# Patient Record
Sex: Male | Born: 1970
Health system: Southern US, Community
[De-identification: ages and names within clinical notes are randomized; demographics above are authoritative.]

## PROBLEM LIST (undated history)

## (undated) DIAGNOSIS — E079 Disorder of thyroid, unspecified: Secondary | ICD-10-CM

## (undated) HISTORY — PX: MYRINGOTOMY WITH TUBE PLACEMENT: SHX5663

## (undated) HISTORY — DX: Disorder of thyroid, unspecified: E07.9

## (undated) HISTORY — PX: APPENDECTOMY: SHX54

## (undated) HISTORY — PX: CHOLECYSTECTOMY: SHX55

---
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
## 2004-07-08 ENCOUNTER — Ambulatory Visit: Payer: Self-pay | Admitting: Otolaryngology

## 2005-07-16 ENCOUNTER — Ambulatory Visit: Payer: Self-pay | Admitting: Otolaryngology

## 2007-08-31 ENCOUNTER — Inpatient Hospital Stay: Payer: Self-pay | Admitting: Internal Medicine

## 2007-08-31 ENCOUNTER — Other Ambulatory Visit: Payer: Self-pay

## 2007-09-02 ENCOUNTER — Other Ambulatory Visit: Payer: Self-pay

## 2009-01-11 ENCOUNTER — Ambulatory Visit: Payer: Self-pay | Admitting: Otolaryngology

## 2009-08-11 LAB — HM COLONOSCOPY

## 2011-08-14 DIAGNOSIS — J301 Allergic rhinitis due to pollen: Secondary | ICD-10-CM | POA: Diagnosis not present

## 2011-08-21 DIAGNOSIS — J301 Allergic rhinitis due to pollen: Secondary | ICD-10-CM | POA: Diagnosis not present

## 2011-09-04 DIAGNOSIS — J301 Allergic rhinitis due to pollen: Secondary | ICD-10-CM | POA: Diagnosis not present

## 2011-09-10 DIAGNOSIS — L738 Other specified follicular disorders: Secondary | ICD-10-CM | POA: Diagnosis not present

## 2011-09-11 DIAGNOSIS — J301 Allergic rhinitis due to pollen: Secondary | ICD-10-CM | POA: Diagnosis not present

## 2011-09-17 DIAGNOSIS — J301 Allergic rhinitis due to pollen: Secondary | ICD-10-CM | POA: Diagnosis not present

## 2011-09-23 DIAGNOSIS — J301 Allergic rhinitis due to pollen: Secondary | ICD-10-CM | POA: Diagnosis not present

## 2011-09-24 DIAGNOSIS — J301 Allergic rhinitis due to pollen: Secondary | ICD-10-CM | POA: Diagnosis not present

## 2011-10-01 DIAGNOSIS — J301 Allergic rhinitis due to pollen: Secondary | ICD-10-CM | POA: Diagnosis not present

## 2011-10-08 DIAGNOSIS — J301 Allergic rhinitis due to pollen: Secondary | ICD-10-CM | POA: Diagnosis not present

## 2011-10-14 DIAGNOSIS — Q760 Spina bifida occulta: Secondary | ICD-10-CM | POA: Insufficient documentation

## 2011-10-14 DIAGNOSIS — Z8774 Personal history of (corrected) congenital malformations of heart and circulatory system: Secondary | ICD-10-CM | POA: Insufficient documentation

## 2011-10-14 DIAGNOSIS — Q909 Down syndrome, unspecified: Secondary | ICD-10-CM | POA: Insufficient documentation

## 2011-10-14 DIAGNOSIS — Q238 Other congenital malformations of aortic and mitral valves: Secondary | ICD-10-CM | POA: Insufficient documentation

## 2011-10-15 DIAGNOSIS — J301 Allergic rhinitis due to pollen: Secondary | ICD-10-CM | POA: Diagnosis not present

## 2011-10-22 DIAGNOSIS — J301 Allergic rhinitis due to pollen: Secondary | ICD-10-CM | POA: Diagnosis not present

## 2011-10-29 DIAGNOSIS — J301 Allergic rhinitis due to pollen: Secondary | ICD-10-CM | POA: Diagnosis not present

## 2011-11-06 DIAGNOSIS — J301 Allergic rhinitis due to pollen: Secondary | ICD-10-CM | POA: Diagnosis not present

## 2011-11-12 DIAGNOSIS — J301 Allergic rhinitis due to pollen: Secondary | ICD-10-CM | POA: Diagnosis not present

## 2011-11-26 DIAGNOSIS — J301 Allergic rhinitis due to pollen: Secondary | ICD-10-CM | POA: Diagnosis not present

## 2011-12-03 DIAGNOSIS — J301 Allergic rhinitis due to pollen: Secondary | ICD-10-CM | POA: Diagnosis not present

## 2011-12-05 DIAGNOSIS — J301 Allergic rhinitis due to pollen: Secondary | ICD-10-CM | POA: Diagnosis not present

## 2011-12-10 DIAGNOSIS — J301 Allergic rhinitis due to pollen: Secondary | ICD-10-CM | POA: Diagnosis not present

## 2011-12-17 DIAGNOSIS — J301 Allergic rhinitis due to pollen: Secondary | ICD-10-CM | POA: Diagnosis not present

## 2011-12-31 DIAGNOSIS — J301 Allergic rhinitis due to pollen: Secondary | ICD-10-CM | POA: Diagnosis not present

## 2012-01-21 DIAGNOSIS — J301 Allergic rhinitis due to pollen: Secondary | ICD-10-CM | POA: Diagnosis not present

## 2012-02-04 DIAGNOSIS — J301 Allergic rhinitis due to pollen: Secondary | ICD-10-CM | POA: Diagnosis not present

## 2012-02-05 DIAGNOSIS — M7520 Bicipital tendinitis, unspecified shoulder: Secondary | ICD-10-CM | POA: Diagnosis not present

## 2012-02-05 DIAGNOSIS — Z23 Encounter for immunization: Secondary | ICD-10-CM | POA: Diagnosis not present

## 2012-02-13 DIAGNOSIS — Z23 Encounter for immunization: Secondary | ICD-10-CM | POA: Diagnosis not present

## 2012-02-13 DIAGNOSIS — J309 Allergic rhinitis, unspecified: Secondary | ICD-10-CM | POA: Diagnosis not present

## 2012-02-18 DIAGNOSIS — J301 Allergic rhinitis due to pollen: Secondary | ICD-10-CM | POA: Diagnosis not present

## 2012-02-20 DIAGNOSIS — J301 Allergic rhinitis due to pollen: Secondary | ICD-10-CM | POA: Diagnosis not present

## 2012-02-26 DIAGNOSIS — J301 Allergic rhinitis due to pollen: Secondary | ICD-10-CM | POA: Diagnosis not present

## 2012-03-03 DIAGNOSIS — J301 Allergic rhinitis due to pollen: Secondary | ICD-10-CM | POA: Diagnosis not present

## 2012-03-10 DIAGNOSIS — J301 Allergic rhinitis due to pollen: Secondary | ICD-10-CM | POA: Diagnosis not present

## 2012-03-17 DIAGNOSIS — J301 Allergic rhinitis due to pollen: Secondary | ICD-10-CM | POA: Diagnosis not present

## 2012-04-07 DIAGNOSIS — J301 Allergic rhinitis due to pollen: Secondary | ICD-10-CM | POA: Diagnosis not present

## 2012-04-14 DIAGNOSIS — J301 Allergic rhinitis due to pollen: Secondary | ICD-10-CM | POA: Diagnosis not present

## 2012-04-21 DIAGNOSIS — J301 Allergic rhinitis due to pollen: Secondary | ICD-10-CM | POA: Diagnosis not present

## 2012-04-26 ENCOUNTER — Ambulatory Visit: Payer: Self-pay | Admitting: Family Medicine

## 2012-04-26 DIAGNOSIS — Z0000 Encounter for general adult medical examination without abnormal findings: Secondary | ICD-10-CM | POA: Diagnosis not present

## 2012-04-26 DIAGNOSIS — M23302 Other meniscus derangements, unspecified lateral meniscus, unspecified knee: Secondary | ICD-10-CM | POA: Diagnosis not present

## 2012-04-26 DIAGNOSIS — E039 Hypothyroidism, unspecified: Secondary | ICD-10-CM | POA: Diagnosis not present

## 2012-04-26 DIAGNOSIS — M25569 Pain in unspecified knee: Secondary | ICD-10-CM | POA: Diagnosis not present

## 2012-04-27 DIAGNOSIS — E039 Hypothyroidism, unspecified: Secondary | ICD-10-CM | POA: Diagnosis not present

## 2012-04-27 DIAGNOSIS — Z23 Encounter for immunization: Secondary | ICD-10-CM | POA: Diagnosis not present

## 2012-04-27 DIAGNOSIS — Z0000 Encounter for general adult medical examination without abnormal findings: Secondary | ICD-10-CM | POA: Diagnosis not present

## 2012-04-29 DIAGNOSIS — J301 Allergic rhinitis due to pollen: Secondary | ICD-10-CM | POA: Diagnosis not present

## 2012-05-06 DIAGNOSIS — J301 Allergic rhinitis due to pollen: Secondary | ICD-10-CM | POA: Diagnosis not present

## 2012-05-13 DIAGNOSIS — J301 Allergic rhinitis due to pollen: Secondary | ICD-10-CM | POA: Diagnosis not present

## 2012-05-13 DIAGNOSIS — H6120 Impacted cerumen, unspecified ear: Secondary | ICD-10-CM | POA: Diagnosis not present

## 2012-05-14 DIAGNOSIS — J301 Allergic rhinitis due to pollen: Secondary | ICD-10-CM | POA: Diagnosis not present

## 2012-05-19 DIAGNOSIS — J301 Allergic rhinitis due to pollen: Secondary | ICD-10-CM | POA: Diagnosis not present

## 2012-06-02 DIAGNOSIS — J301 Allergic rhinitis due to pollen: Secondary | ICD-10-CM | POA: Diagnosis not present

## 2012-06-09 DIAGNOSIS — J301 Allergic rhinitis due to pollen: Secondary | ICD-10-CM | POA: Diagnosis not present

## 2012-06-16 DIAGNOSIS — J301 Allergic rhinitis due to pollen: Secondary | ICD-10-CM | POA: Diagnosis not present

## 2012-06-22 DIAGNOSIS — J301 Allergic rhinitis due to pollen: Secondary | ICD-10-CM | POA: Diagnosis not present

## 2012-07-01 DIAGNOSIS — Q248 Other specified congenital malformations of heart: Secondary | ICD-10-CM | POA: Diagnosis not present

## 2012-07-12 ENCOUNTER — Emergency Department: Payer: Self-pay | Admitting: Emergency Medicine

## 2012-07-12 DIAGNOSIS — R6889 Other general symptoms and signs: Secondary | ICD-10-CM | POA: Diagnosis not present

## 2012-07-12 DIAGNOSIS — Q909 Down syndrome, unspecified: Secondary | ICD-10-CM | POA: Diagnosis not present

## 2012-07-12 DIAGNOSIS — M940 Chondrocostal junction syndrome [Tietze]: Secondary | ICD-10-CM | POA: Diagnosis not present

## 2012-07-12 DIAGNOSIS — Z9089 Acquired absence of other organs: Secondary | ICD-10-CM | POA: Diagnosis not present

## 2012-07-12 DIAGNOSIS — R0789 Other chest pain: Secondary | ICD-10-CM | POA: Diagnosis not present

## 2012-07-12 DIAGNOSIS — R079 Chest pain, unspecified: Secondary | ICD-10-CM | POA: Diagnosis not present

## 2012-07-12 LAB — COMPREHENSIVE METABOLIC PANEL
Albumin: 3.7 g/dL (ref 3.4–5.0)
Alkaline Phosphatase: 86 U/L (ref 50–136)
Anion Gap: 5 — ABNORMAL LOW (ref 7–16)
BUN: 19 mg/dL — ABNORMAL HIGH (ref 7–18)
Bilirubin,Total: 0.6 mg/dL (ref 0.2–1.0)
Creatinine: 1.39 mg/dL — ABNORMAL HIGH (ref 0.60–1.30)
EGFR (Non-African Amer.): >60
Glucose: 100 mg/dL — ABNORMAL HIGH (ref 65–99)
Osmolality: 285 (ref 275–301)
SGPT (ALT): 47 U/L (ref 12–78)
Sodium: 142 mmol/L (ref 136–145)
Total Protein: 7.6 g/dL (ref 6.4–8.2)

## 2012-07-12 LAB — CBC
HGB: 16.7 g/dL (ref 13.0–18.0)
MCH: 32.3 pg (ref 26.0–34.0)
MCHC: 34.8 g/dL (ref 32.0–36.0)
MCV: 93 fL (ref 80–100)
Platelet: 209 10*3/uL (ref 150–440)
WBC: 5.2 10*3/uL (ref 3.8–10.6)

## 2012-07-12 LAB — TROPONIN I: Troponin-I: <0.02 ng/mL

## 2012-07-16 DIAGNOSIS — J301 Allergic rhinitis due to pollen: Secondary | ICD-10-CM | POA: Diagnosis not present

## 2012-07-19 DIAGNOSIS — R071 Chest pain on breathing: Secondary | ICD-10-CM | POA: Diagnosis not present

## 2012-10-07 ENCOUNTER — Ambulatory Visit: Payer: Self-pay | Admitting: Family Medicine

## 2012-10-07 DIAGNOSIS — M25569 Pain in unspecified knee: Secondary | ICD-10-CM | POA: Diagnosis not present

## 2012-10-07 DIAGNOSIS — Z23 Encounter for immunization: Secondary | ICD-10-CM | POA: Diagnosis not present

## 2012-10-07 DIAGNOSIS — M25469 Effusion, unspecified knee: Secondary | ICD-10-CM | POA: Diagnosis not present

## 2012-10-07 DIAGNOSIS — Z0000 Encounter for general adult medical examination without abnormal findings: Secondary | ICD-10-CM | POA: Diagnosis not present

## 2012-12-31 DIAGNOSIS — J301 Allergic rhinitis due to pollen: Secondary | ICD-10-CM | POA: Diagnosis not present

## 2013-01-05 DIAGNOSIS — J301 Allergic rhinitis due to pollen: Secondary | ICD-10-CM | POA: Diagnosis not present

## 2013-01-19 DIAGNOSIS — J301 Allergic rhinitis due to pollen: Secondary | ICD-10-CM | POA: Diagnosis not present

## 2013-01-26 DIAGNOSIS — J301 Allergic rhinitis due to pollen: Secondary | ICD-10-CM | POA: Diagnosis not present

## 2013-02-02 DIAGNOSIS — J301 Allergic rhinitis due to pollen: Secondary | ICD-10-CM | POA: Diagnosis not present

## 2013-02-15 DIAGNOSIS — J301 Allergic rhinitis due to pollen: Secondary | ICD-10-CM | POA: Diagnosis not present

## 2013-02-24 DIAGNOSIS — J301 Allergic rhinitis due to pollen: Secondary | ICD-10-CM | POA: Diagnosis not present

## 2013-03-03 DIAGNOSIS — J301 Allergic rhinitis due to pollen: Secondary | ICD-10-CM | POA: Diagnosis not present

## 2013-03-10 DIAGNOSIS — J301 Allergic rhinitis due to pollen: Secondary | ICD-10-CM | POA: Diagnosis not present

## 2013-03-17 DIAGNOSIS — J301 Allergic rhinitis due to pollen: Secondary | ICD-10-CM | POA: Diagnosis not present

## 2013-03-25 DIAGNOSIS — J301 Allergic rhinitis due to pollen: Secondary | ICD-10-CM | POA: Diagnosis not present

## 2013-03-31 DIAGNOSIS — J301 Allergic rhinitis due to pollen: Secondary | ICD-10-CM | POA: Diagnosis not present

## 2013-04-07 DIAGNOSIS — J301 Allergic rhinitis due to pollen: Secondary | ICD-10-CM | POA: Diagnosis not present

## 2013-04-14 DIAGNOSIS — J301 Allergic rhinitis due to pollen: Secondary | ICD-10-CM | POA: Diagnosis not present

## 2013-04-21 DIAGNOSIS — J301 Allergic rhinitis due to pollen: Secondary | ICD-10-CM | POA: Diagnosis not present

## 2013-04-28 DIAGNOSIS — J301 Allergic rhinitis due to pollen: Secondary | ICD-10-CM | POA: Diagnosis not present

## 2013-05-04 DIAGNOSIS — Z23 Encounter for immunization: Secondary | ICD-10-CM | POA: Diagnosis not present

## 2013-05-04 DIAGNOSIS — Z0000 Encounter for general adult medical examination without abnormal findings: Secondary | ICD-10-CM | POA: Diagnosis not present

## 2013-05-04 DIAGNOSIS — M25569 Pain in unspecified knee: Secondary | ICD-10-CM | POA: Diagnosis not present

## 2013-05-05 DIAGNOSIS — J301 Allergic rhinitis due to pollen: Secondary | ICD-10-CM | POA: Diagnosis not present

## 2013-05-06 DIAGNOSIS — Z13220 Encounter for screening for lipoid disorders: Secondary | ICD-10-CM | POA: Diagnosis not present

## 2013-05-06 DIAGNOSIS — Z131 Encounter for screening for diabetes mellitus: Secondary | ICD-10-CM | POA: Diagnosis not present

## 2013-05-06 LAB — LIPID PANEL
Cholesterol: 261 mg/dL — AB (ref 0–200)
HDL: 41 mg/dL (ref 35–70)
LDL CALC: 161 mg/dL
TRIGLYCERIDES: 295 mg/dL — AB (ref 40–160)

## 2013-05-06 LAB — BASIC METABOLIC PANEL
BUN: 21 mg/dL (ref 4–21)
Creatinine: 1.3 mg/dL (ref 0.6–1.3)
GLUCOSE: 105 mg/dL
Potassium: 4.6 mmol/L (ref 3.4–5.3)
Sodium: 141 mmol/L (ref 137–147)

## 2013-05-12 DIAGNOSIS — J301 Allergic rhinitis due to pollen: Secondary | ICD-10-CM | POA: Diagnosis not present

## 2013-05-26 DIAGNOSIS — J301 Allergic rhinitis due to pollen: Secondary | ICD-10-CM | POA: Diagnosis not present

## 2013-05-26 DIAGNOSIS — H6120 Impacted cerumen, unspecified ear: Secondary | ICD-10-CM | POA: Diagnosis not present

## 2013-05-27 DIAGNOSIS — H6120 Impacted cerumen, unspecified ear: Secondary | ICD-10-CM | POA: Diagnosis not present

## 2013-05-27 DIAGNOSIS — J301 Allergic rhinitis due to pollen: Secondary | ICD-10-CM | POA: Diagnosis not present

## 2013-06-02 DIAGNOSIS — J301 Allergic rhinitis due to pollen: Secondary | ICD-10-CM | POA: Diagnosis not present

## 2013-06-09 DIAGNOSIS — J301 Allergic rhinitis due to pollen: Secondary | ICD-10-CM | POA: Diagnosis not present

## 2013-06-16 DIAGNOSIS — J301 Allergic rhinitis due to pollen: Secondary | ICD-10-CM | POA: Diagnosis not present

## 2013-06-17 DIAGNOSIS — J301 Allergic rhinitis due to pollen: Secondary | ICD-10-CM | POA: Diagnosis not present

## 2013-06-30 DIAGNOSIS — J301 Allergic rhinitis due to pollen: Secondary | ICD-10-CM | POA: Diagnosis not present

## 2013-07-12 DIAGNOSIS — E039 Hypothyroidism, unspecified: Secondary | ICD-10-CM | POA: Diagnosis not present

## 2013-07-14 DIAGNOSIS — J301 Allergic rhinitis due to pollen: Secondary | ICD-10-CM | POA: Diagnosis not present

## 2013-07-21 DIAGNOSIS — J301 Allergic rhinitis due to pollen: Secondary | ICD-10-CM | POA: Diagnosis not present

## 2013-07-28 DIAGNOSIS — Q248 Other specified congenital malformations of heart: Secondary | ICD-10-CM | POA: Diagnosis not present

## 2013-07-28 DIAGNOSIS — J301 Allergic rhinitis due to pollen: Secondary | ICD-10-CM | POA: Diagnosis not present

## 2013-08-17 DIAGNOSIS — H47019 Ischemic optic neuropathy, unspecified eye: Secondary | ICD-10-CM | POA: Diagnosis not present

## 2013-08-18 DIAGNOSIS — J301 Allergic rhinitis due to pollen: Secondary | ICD-10-CM | POA: Diagnosis not present

## 2013-08-25 DIAGNOSIS — J301 Allergic rhinitis due to pollen: Secondary | ICD-10-CM | POA: Diagnosis not present

## 2013-09-01 DIAGNOSIS — J301 Allergic rhinitis due to pollen: Secondary | ICD-10-CM | POA: Diagnosis not present

## 2013-09-09 DIAGNOSIS — J301 Allergic rhinitis due to pollen: Secondary | ICD-10-CM | POA: Diagnosis not present

## 2013-09-13 DIAGNOSIS — J301 Allergic rhinitis due to pollen: Secondary | ICD-10-CM | POA: Diagnosis not present

## 2013-09-20 DIAGNOSIS — J301 Allergic rhinitis due to pollen: Secondary | ICD-10-CM | POA: Diagnosis not present

## 2013-10-25 DIAGNOSIS — J301 Allergic rhinitis due to pollen: Secondary | ICD-10-CM | POA: Diagnosis not present

## 2013-11-10 DIAGNOSIS — J301 Allergic rhinitis due to pollen: Secondary | ICD-10-CM | POA: Diagnosis not present

## 2013-11-22 DIAGNOSIS — J301 Allergic rhinitis due to pollen: Secondary | ICD-10-CM | POA: Diagnosis not present

## 2013-11-28 DIAGNOSIS — E039 Hypothyroidism, unspecified: Secondary | ICD-10-CM | POA: Diagnosis not present

## 2013-12-02 DIAGNOSIS — J301 Allergic rhinitis due to pollen: Secondary | ICD-10-CM | POA: Diagnosis not present

## 2013-12-20 DIAGNOSIS — J301 Allergic rhinitis due to pollen: Secondary | ICD-10-CM | POA: Diagnosis not present

## 2013-12-29 DIAGNOSIS — J301 Allergic rhinitis due to pollen: Secondary | ICD-10-CM | POA: Diagnosis not present

## 2014-01-05 DIAGNOSIS — J301 Allergic rhinitis due to pollen: Secondary | ICD-10-CM | POA: Diagnosis not present

## 2014-01-19 DIAGNOSIS — J301 Allergic rhinitis due to pollen: Secondary | ICD-10-CM | POA: Diagnosis not present

## 2014-01-26 DIAGNOSIS — J301 Allergic rhinitis due to pollen: Secondary | ICD-10-CM | POA: Diagnosis not present

## 2014-01-27 DIAGNOSIS — E039 Hypothyroidism, unspecified: Secondary | ICD-10-CM | POA: Diagnosis not present

## 2014-02-24 DIAGNOSIS — J301 Allergic rhinitis due to pollen: Secondary | ICD-10-CM | POA: Diagnosis not present

## 2014-04-25 DIAGNOSIS — E039 Hypothyroidism, unspecified: Secondary | ICD-10-CM | POA: Diagnosis not present

## 2014-04-25 LAB — TSH: TSH: 0.77 u[IU]/mL (ref 0.41–5.90)

## 2014-05-11 DIAGNOSIS — J301 Allergic rhinitis due to pollen: Secondary | ICD-10-CM | POA: Diagnosis not present

## 2014-05-30 DIAGNOSIS — J301 Allergic rhinitis due to pollen: Secondary | ICD-10-CM | POA: Diagnosis not present

## 2014-05-30 DIAGNOSIS — H6123 Impacted cerumen, bilateral: Secondary | ICD-10-CM | POA: Diagnosis not present

## 2014-06-01 DIAGNOSIS — J301 Allergic rhinitis due to pollen: Secondary | ICD-10-CM | POA: Diagnosis not present

## 2014-06-02 DIAGNOSIS — J301 Allergic rhinitis due to pollen: Secondary | ICD-10-CM | POA: Diagnosis not present

## 2014-06-07 DIAGNOSIS — L500 Allergic urticaria: Secondary | ICD-10-CM | POA: Diagnosis not present

## 2014-07-13 DIAGNOSIS — J301 Allergic rhinitis due to pollen: Secondary | ICD-10-CM | POA: Diagnosis not present

## 2014-08-24 DIAGNOSIS — J301 Allergic rhinitis due to pollen: Secondary | ICD-10-CM | POA: Diagnosis not present

## 2014-08-25 DIAGNOSIS — J301 Allergic rhinitis due to pollen: Secondary | ICD-10-CM | POA: Diagnosis not present

## 2014-09-02 IMAGING — CR DG KNEE COMPLETE 4+V*L*
1 series · 4 of 4 positions shown · non-contrast
Comparison: none

REASON FOR EXAM: left knee pain
COMMENTS:

PROCEDURE:     KDR - KDXR KNEE LT COMP WITH OBLIQUES  - October 07, 2012 [DATE]
RESULT:     Comparison:  None

[Series 1: ap · 0.17mm/px · 4 of 4 slices shown]
[im 1/4]
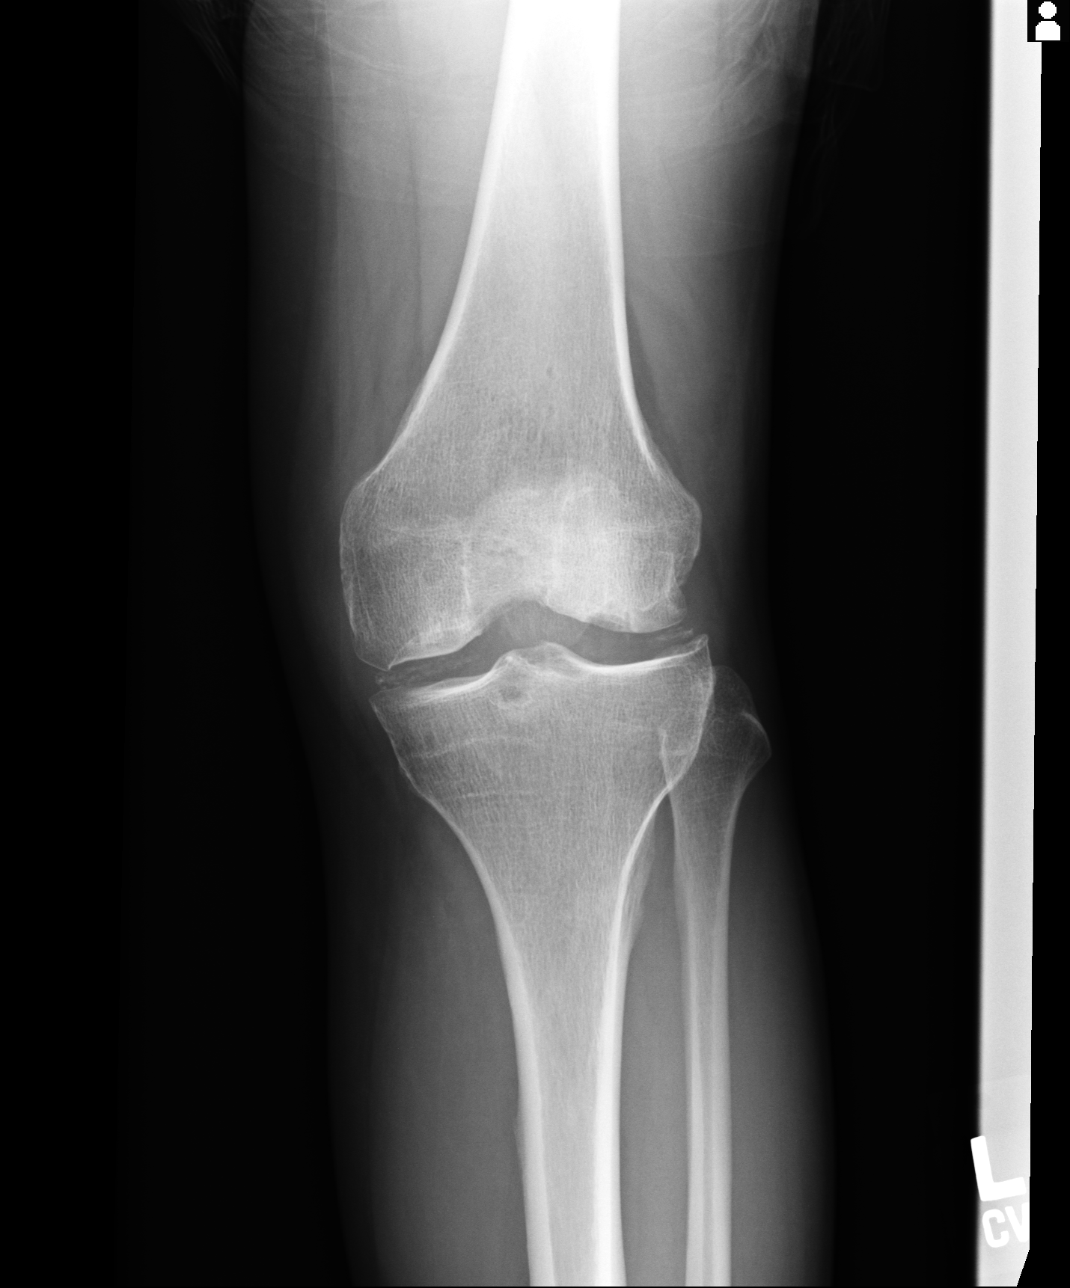
[im 2/4]
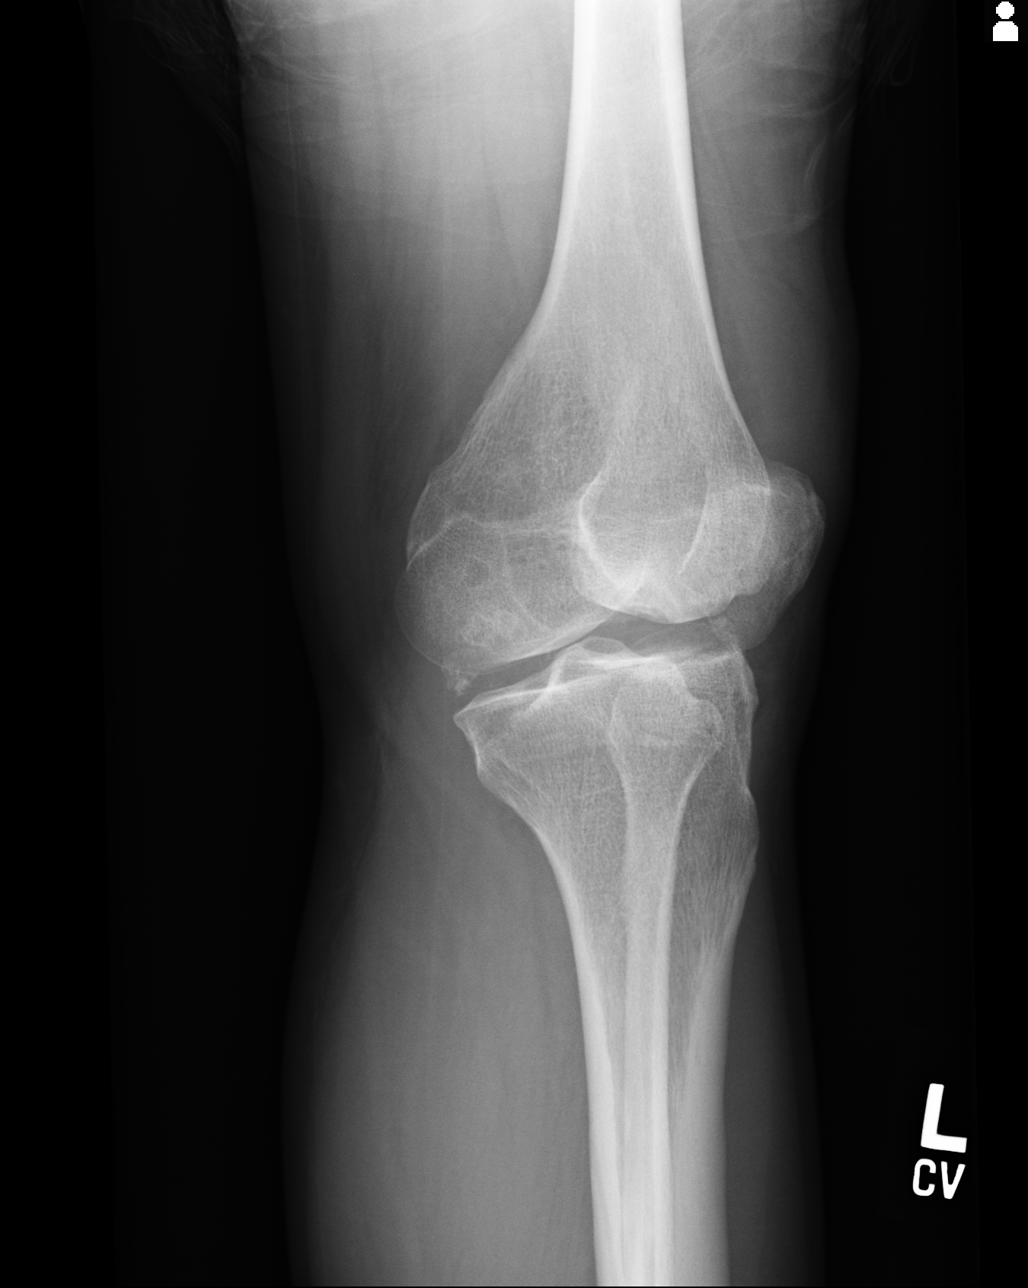
[im 3/4]
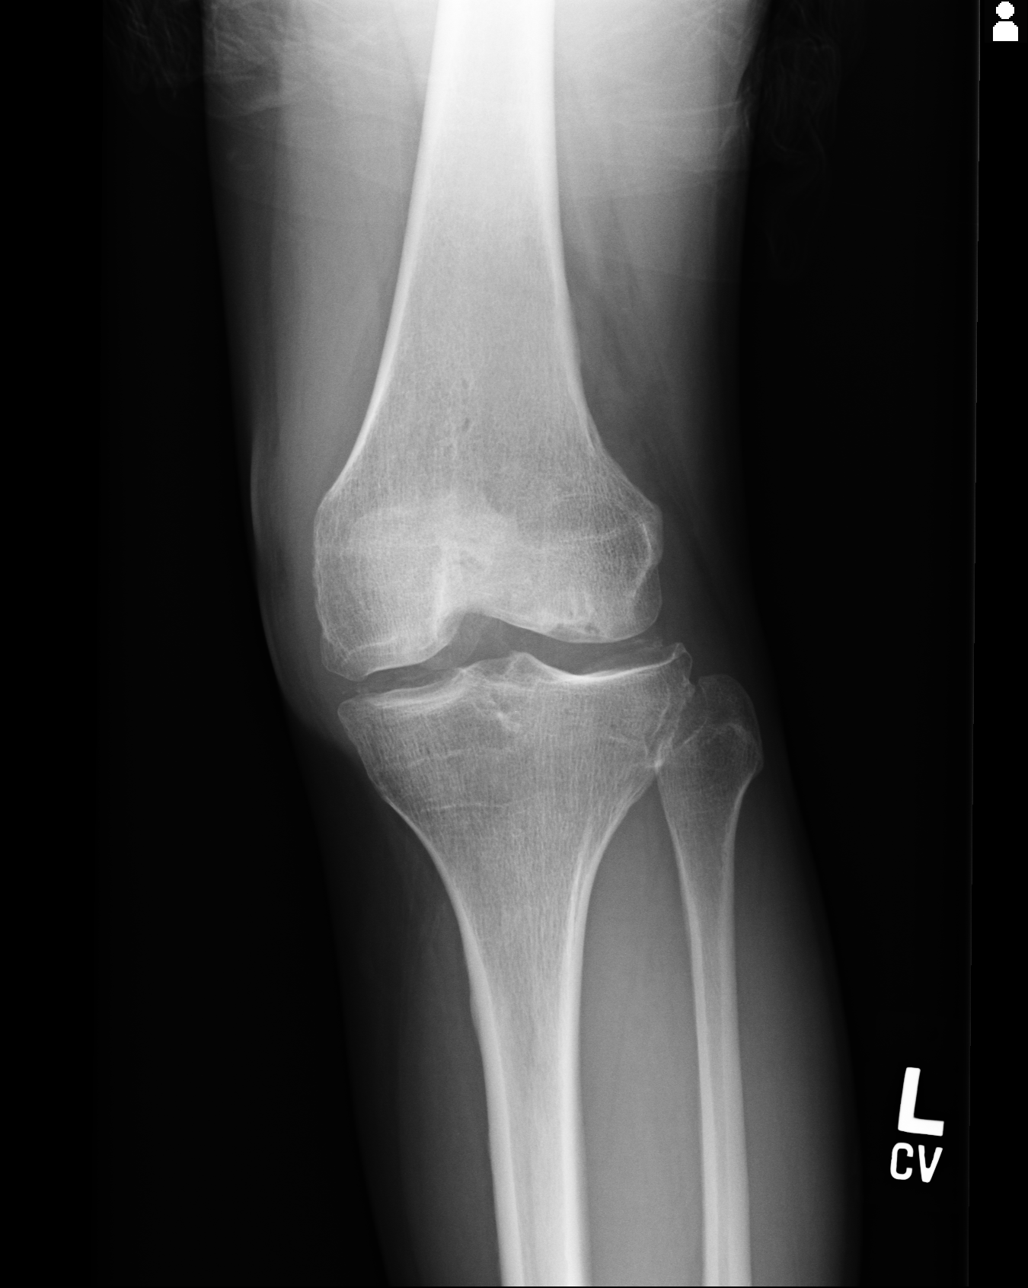
[im 4/4]
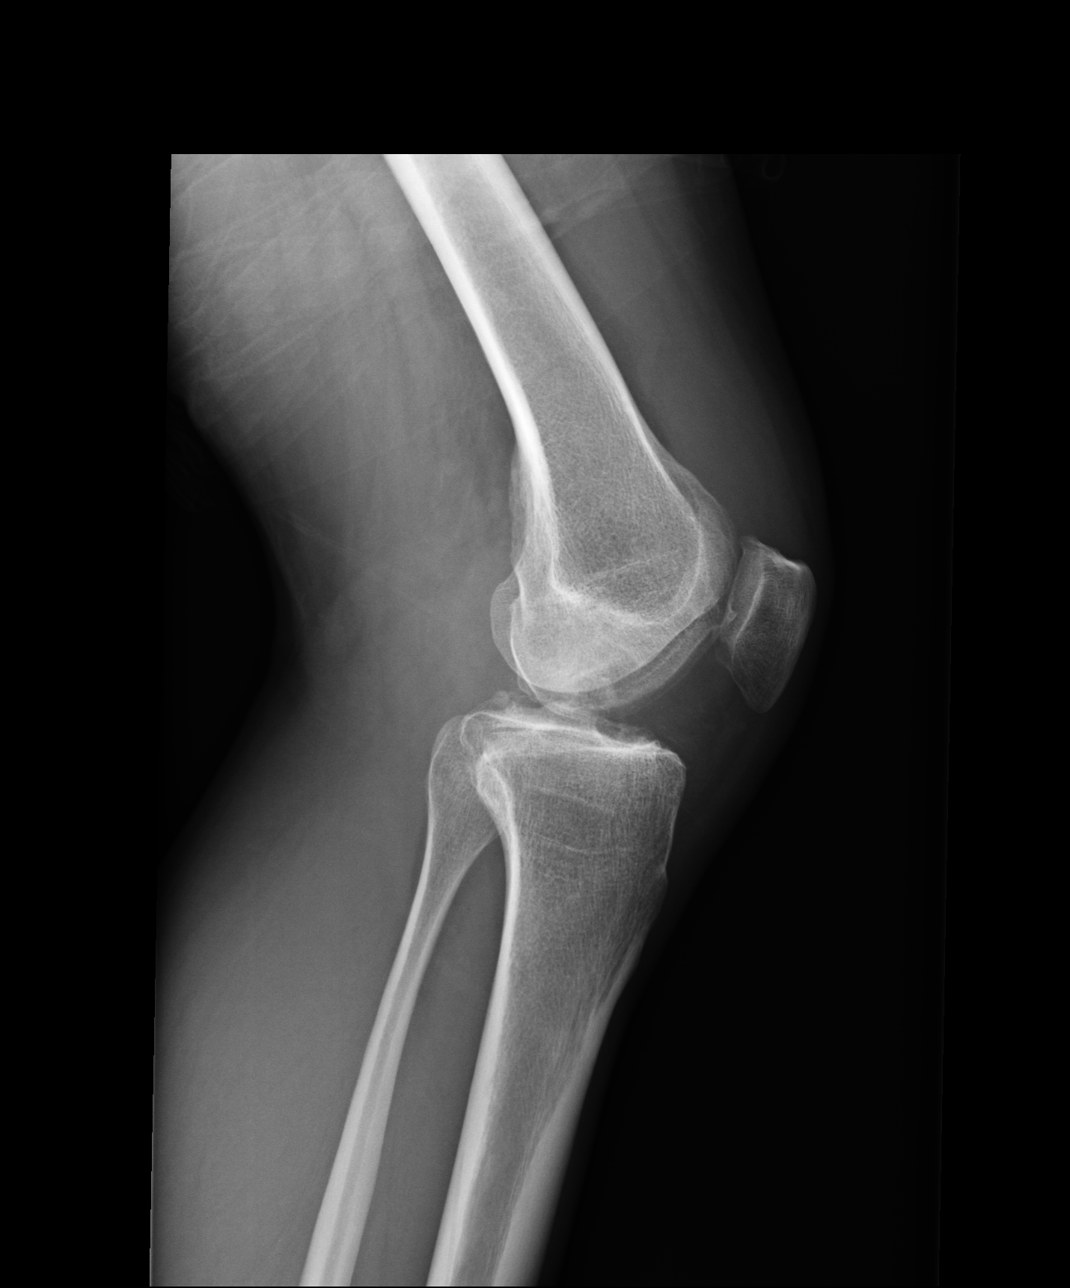

[4 of 4 positions shown; findings below may reference images not displayed]

FINDINGS: 4 views of the left knee demonstrates no acute fracture or dislocation.
There is a moderate joint effusion. There is chondrocalcinosis of the medial
and lateral tibiofemoral compartments. There is an osteochondral lesion
involving the medial aspect of the lateral femoral condyle. There are tiny
lateral tibiofemoral compartment and patellofemoral compartment marginal
osteophytes.
IMPRESSION: No acute osseous injury of the left knee.

[REDACTED]

## 2014-09-07 DIAGNOSIS — J301 Allergic rhinitis due to pollen: Secondary | ICD-10-CM | POA: Diagnosis not present

## 2014-09-14 DIAGNOSIS — J301 Allergic rhinitis due to pollen: Secondary | ICD-10-CM | POA: Diagnosis not present

## 2014-09-21 DIAGNOSIS — J301 Allergic rhinitis due to pollen: Secondary | ICD-10-CM | POA: Diagnosis not present

## 2014-09-28 DIAGNOSIS — J301 Allergic rhinitis due to pollen: Secondary | ICD-10-CM | POA: Diagnosis not present

## 2014-10-05 DIAGNOSIS — J301 Allergic rhinitis due to pollen: Secondary | ICD-10-CM | POA: Diagnosis not present

## 2014-10-12 DIAGNOSIS — J301 Allergic rhinitis due to pollen: Secondary | ICD-10-CM | POA: Diagnosis not present

## 2014-10-19 DIAGNOSIS — J301 Allergic rhinitis due to pollen: Secondary | ICD-10-CM | POA: Diagnosis not present

## 2014-10-26 DIAGNOSIS — J301 Allergic rhinitis due to pollen: Secondary | ICD-10-CM | POA: Diagnosis not present

## 2014-11-02 DIAGNOSIS — J301 Allergic rhinitis due to pollen: Secondary | ICD-10-CM | POA: Diagnosis not present

## 2014-11-09 DIAGNOSIS — J301 Allergic rhinitis due to pollen: Secondary | ICD-10-CM | POA: Diagnosis not present

## 2014-11-16 DIAGNOSIS — J301 Allergic rhinitis due to pollen: Secondary | ICD-10-CM | POA: Diagnosis not present

## 2014-11-17 DIAGNOSIS — J301 Allergic rhinitis due to pollen: Secondary | ICD-10-CM | POA: Diagnosis not present

## 2014-11-30 DIAGNOSIS — J301 Allergic rhinitis due to pollen: Secondary | ICD-10-CM | POA: Diagnosis not present

## 2014-12-07 DIAGNOSIS — J301 Allergic rhinitis due to pollen: Secondary | ICD-10-CM | POA: Diagnosis not present

## 2014-12-14 DIAGNOSIS — J301 Allergic rhinitis due to pollen: Secondary | ICD-10-CM | POA: Diagnosis not present

## 2014-12-21 DIAGNOSIS — J301 Allergic rhinitis due to pollen: Secondary | ICD-10-CM | POA: Diagnosis not present

## 2014-12-28 DIAGNOSIS — J301 Allergic rhinitis due to pollen: Secondary | ICD-10-CM | POA: Diagnosis not present

## 2015-01-04 DIAGNOSIS — J301 Allergic rhinitis due to pollen: Secondary | ICD-10-CM | POA: Diagnosis not present

## 2015-01-11 DIAGNOSIS — J301 Allergic rhinitis due to pollen: Secondary | ICD-10-CM | POA: Diagnosis not present

## 2015-01-30 DIAGNOSIS — M109 Gout, unspecified: Secondary | ICD-10-CM | POA: Diagnosis not present

## 2015-02-08 ENCOUNTER — Other Ambulatory Visit: Payer: Self-pay | Admitting: Family Medicine

## 2015-02-13 ENCOUNTER — Other Ambulatory Visit: Payer: Self-pay | Admitting: Family Medicine

## 2015-02-13 DIAGNOSIS — K219 Gastro-esophageal reflux disease without esophagitis: Secondary | ICD-10-CM

## 2015-02-13 MED ORDER — PANTOPRAZOLE SODIUM 40 MG PO TBEC: 40.0000 mg | DELAYED_RELEASE_TABLET | Freq: Two times a day (BID) | ORAL | Status: DC

## 2015-02-16 DIAGNOSIS — J301 Allergic rhinitis due to pollen: Secondary | ICD-10-CM | POA: Diagnosis not present

## 2015-03-19 ENCOUNTER — Telehealth: Payer: Self-pay | Admitting: Family Medicine

## 2015-03-19 MED ORDER — AMOXICILLIN 500 MG PO TABS: 2000.0000 mg | ORAL_TABLET | Freq: Once | ORAL | Status: DC

## 2015-03-19 NOTE — Telephone Encounter (Signed)
[facility] [place]

## 2015-03-19 NOTE — Telephone Encounter (Signed)
Patient notified

## 2015-03-19 NOTE — Telephone Encounter (Signed)
Pt's sister called saying her brother needs antibiotic prior to his dental appt next week,.  [facility] [place].    Her call back [phone]  Thanks Barth Kirks

## 2015-04-17 ENCOUNTER — Other Ambulatory Visit: Payer: Self-pay | Admitting: Family Medicine

## 2015-12-17 ENCOUNTER — Other Ambulatory Visit: Payer: Self-pay | Admitting: Family Medicine

## 2015-12-17 ENCOUNTER — Telehealth: Payer: Self-pay | Admitting: Family Medicine

## 2015-12-17 DIAGNOSIS — Q211 Atrial septal defect, unspecified: Secondary | ICD-10-CM

## 2015-12-17 MED ORDER — AMOXICILLIN 500 MG PO TABS: 2000.0000 mg | ORAL_TABLET | Freq: Once | ORAL | Status: DC

## 2015-12-17 NOTE — Telephone Encounter (Signed)
Pt's sister April contacted office for refill request on the following medications: amoxicillin (AMOXIL) 500 MG tablet to [facility] [place].  April stated that with pt's condition it is required that pt take an antibiotic whenever he is having a dental procedure done. Pt is have one done next week and April is requesting a weeks supply of amoxicillin (AMOXIL) 500 MG tablet be sent [facility] [place].  Last written on: 03/19/15 by Dr. Sherrie MustacheFisher Last OV: 06/07/14 with Dr. Sherrie MustacheFisher (acute visit) Pt last saw Nadine CountsBob on 05/04/13. April stated that Nadine CountsBob is pt's PCP. Please advise. Thanks TNP

## 2015-12-17 NOTE — Telephone Encounter (Signed)
Please advise. KW 

## 2016-02-08 DIAGNOSIS — E039 Hypothyroidism, unspecified: Secondary | ICD-10-CM | POA: Insufficient documentation

## 2016-02-08 DIAGNOSIS — J309 Allergic rhinitis, unspecified: Secondary | ICD-10-CM | POA: Insufficient documentation

## 2016-02-08 DIAGNOSIS — K219 Gastro-esophageal reflux disease without esophagitis: Secondary | ICD-10-CM | POA: Insufficient documentation

## 2016-02-08 DIAGNOSIS — Z87798 Personal history of other (corrected) congenital malformations: Secondary | ICD-10-CM | POA: Insufficient documentation

## 2016-02-13 ENCOUNTER — Other Ambulatory Visit: Payer: Self-pay | Admitting: Family Medicine

## 2016-02-13 DIAGNOSIS — K219 Gastro-esophageal reflux disease without esophagitis: Secondary | ICD-10-CM

## 2016-02-13 MED ORDER — PANTOPRAZOLE SODIUM 40 MG PO TBEC: 40.0000 mg | DELAYED_RELEASE_TABLET | Freq: Two times a day (BID) | ORAL | Status: DC

## 2016-04-08 ENCOUNTER — Other Ambulatory Visit: Payer: Self-pay | Admitting: Family Medicine

## 2016-04-08 DIAGNOSIS — K219 Gastro-esophageal reflux disease without esophagitis: Secondary | ICD-10-CM

## 2016-04-16 ENCOUNTER — Encounter: Payer: Self-pay | Admitting: Family Medicine

## 2016-05-08 ENCOUNTER — Ambulatory Visit (INDEPENDENT_AMBULATORY_CARE_PROVIDER_SITE_OTHER): Payer: Medicare Other | Admitting: Family Medicine

## 2016-05-08 ENCOUNTER — Encounter: Payer: Self-pay | Admitting: Family Medicine

## 2016-05-08 VITALS — BP 92/58 | HR 80 | Temp 99.0°F | Resp 15 | Ht <= 58 in | Wt 95.2 lb

## 2016-05-08 DIAGNOSIS — E039 Hypothyroidism, unspecified: Secondary | ICD-10-CM

## 2016-05-08 DIAGNOSIS — K219 Gastro-esophageal reflux disease without esophagitis: Secondary | ICD-10-CM

## 2016-05-08 DIAGNOSIS — Z87798 Personal history of other (corrected) congenital malformations: Secondary | ICD-10-CM

## 2016-05-08 DIAGNOSIS — F329 Major depressive disorder, single episode, unspecified: Secondary | ICD-10-CM

## 2016-05-08 DIAGNOSIS — Q909 Down syndrome, unspecified: Secondary | ICD-10-CM | POA: Diagnosis not present

## 2016-05-08 DIAGNOSIS — Z0000 Encounter for general adult medical examination without abnormal findings: Secondary | ICD-10-CM

## 2016-05-08 DIAGNOSIS — M170 Bilateral primary osteoarthritis of knee: Secondary | ICD-10-CM | POA: Insufficient documentation

## 2016-05-08 DIAGNOSIS — Z23 Encounter for immunization: Secondary | ICD-10-CM

## 2016-05-08 DIAGNOSIS — F32A Depression, unspecified: Secondary | ICD-10-CM

## 2016-05-08 MED ORDER — RANITIDINE HCL 150 MG PO TABS: 150.0000 mg | ORAL_TABLET | Freq: Every day | ORAL | 11 refills | Status: DC

## 2016-05-08 MED ORDER — SERTRALINE HCL 50 MG PO TABS: 50.0000 mg | ORAL_TABLET | Freq: Every day | ORAL | 0 refills | Status: DC

## 2016-05-08 NOTE — Progress Notes (Signed)
Subjective:     Patient ID: Jacob Schmidt, male   DOB: 1971/05/22, 45 y.o.   MRN: [id number]  HPI  Chief Complaint  Patient presents with  . Annual Exam    Patient comes in office accompanied by his younger sister who is now his legal guardian for his annual physical today. Sister states that father died a year ago and mother has dementia, patient is now living with her and she has concerns over his recent weight loss since being in her care. Sister would also like to address today frequent episodes of gout flares that patient has been having lately. She states patient is unable to articulate when he is in pain and believes he is having a flare up.   Has a prior hx of osteoarthritis. Sister states he has lost interest in activities both due to arthritis and sadness over his father's death. She states she will sometimes find him lying in the dark and skipping meals at times. Current household includes his sister,her husband, and her 3 children (10-17-10)   Review of SystemsGeneral: Feeling well HEENT: regular dental visits and eye exams. Chronic right esotropia. Cardiovascular: no chest pain, shortness of breath, or palpitations GI: heartburn controlled by medication. No change in bowel habits or blood in the stool GU: nocturia x 0, no change in bladder habits  Psychiatric: appears depressed which coincides with his sister's observations Musculoskeletal: Intermittent inflamed ankles and knees. Has been diagnosed with gout per Urgent Care in the past. Has knee osteoarthritis per prior knee x-rays.     Objective:   Physical Exam  Constitutional: He appears well-developed and well-nourished. No distress.  Psychiatric:  Sad affect  Eyes: PERRLA, Right esotropia Neck: no thyromegaly, tenderness or nodules,  ENT: TM's intact without inflammation; No tonsillar enlargement or exudate, Lungs: Clear Heart : RRR with 3/6 mid-systolic murmur Abd: bowel sounds present, soft, non-tender, no  organomegaly Extremities: no edema, Left knee is warm, mildly erythematous, and tender to the touch. Resists ROM.      Assessment:    1. Need for influenza vaccination - Flu Vaccine QUAD 36+ mos IM  2. Down's syndrome  3. H/O congenital malformation  4. Hypothyroidism, unspecified hypothyroidism type - T4, free - TSH  5. Gastroesophageal reflux disease without esophagitis - ranitidine (ZANTAC) 150 MG tablet; Take 1 tablet (150 mg total) by mouth at bedtime.  Dispense: 90 tablet; Refill: 11  6. Bilateral primary osteoarthritis of knee Uric acid level 7. Depression - sertraline (ZOLOFT) 50 MG tablet; Take 1 tablet (50 mg total) by mouth daily. Start at 1/2 pill for the first two weeks.  Dispense: 30 tablet; Refill: 0  8. Annual physical exam - Lipid panel - Comprehensive metabolic panel    Plan:   Follow-up with cardiology. Discussed tapering off Protonix. Discussed Two Aleve twice daily for knee arthritis.  F/u in two weeks and pending lab work.

## 2016-05-08 NOTE — Patient Instructions (Addendum)
Please make cardiology appointment. We will call you with the lab results. Once he has started Zantac change Protonix to every other day then stop after a week. Use Aleve twice daily with food for arthritis pain.

## 2016-05-09 DIAGNOSIS — Z0000 Encounter for general adult medical examination without abnormal findings: Secondary | ICD-10-CM | POA: Diagnosis not present

## 2016-05-09 DIAGNOSIS — E039 Hypothyroidism, unspecified: Secondary | ICD-10-CM | POA: Diagnosis not present

## 2016-05-09 DIAGNOSIS — F329 Major depressive disorder, single episode, unspecified: Secondary | ICD-10-CM | POA: Diagnosis not present

## 2016-05-10 LAB — LIPID PANEL
CHOL/HDL RATIO: 4 ratio (ref 0.0–5.0)
CHOLESTEROL TOTAL: 178 mg/dL (ref 100–199)
HDL: 45 mg/dL (ref >39)
LDL Calculated: 120 mg/dL — ABNORMAL HIGH (ref 0–99)
TRIGLYCERIDES: 65 mg/dL (ref 0–149)
VLDL Cholesterol Cal: 13 mg/dL (ref 5–40)

## 2016-05-10 LAB — COMPREHENSIVE METABOLIC PANEL
ALBUMIN: 3.9 g/dL (ref 3.5–5.5)
ALK PHOS: 76 IU/L (ref 39–117)
ALT: 18 IU/L (ref 0–44)
AST: 20 IU/L (ref 0–40)
Albumin/Globulin Ratio: 1.3 (ref 1.2–2.2)
BUN / CREAT RATIO: 10 (ref 9–20)
BUN: 13 mg/dL (ref 6–24)
Bilirubin Total: 1 mg/dL (ref 0.0–1.2)
CO2: 26 mmol/L (ref 18–29)
CREATININE: 1.3 mg/dL — AB (ref 0.76–1.27)
Calcium: 9.1 mg/dL (ref 8.7–10.2)
Chloride: 99 mmol/L (ref 96–106)
GFR calc Af Amer: 75 mL/min/{1.73_m2} (ref >59)
GFR calc non Af Amer: 65 mL/min/{1.73_m2} (ref >59)
GLUCOSE: 119 mg/dL — AB (ref 65–99)
Globulin, Total: 3 g/dL (ref 1.5–4.5)
Potassium: 5 mmol/L (ref 3.5–5.2)
Sodium: 139 mmol/L (ref 134–144)
Total Protein: 6.9 g/dL (ref 6.0–8.5)

## 2016-05-10 LAB — T4, FREE: Free T4: 1 ng/dL (ref 0.82–1.77)

## 2016-05-10 LAB — TSH: TSH: 6.22 u[IU]/mL — ABNORMAL HIGH (ref 0.450–4.500)

## 2016-05-10 LAB — URIC ACID: URIC ACID: 6.7 mg/dL (ref 3.7–8.6)

## 2016-05-12 ENCOUNTER — Other Ambulatory Visit: Payer: Self-pay

## 2016-05-12 DIAGNOSIS — E039 Hypothyroidism, unspecified: Secondary | ICD-10-CM

## 2016-05-12 MED ORDER — LEVOTHYROXINE SODIUM 100 MCG PO TABS: ORAL_TABLET | ORAL | 3 refills | Status: DC

## 2016-05-12 NOTE — Telephone Encounter (Signed)
-----   Message from Anola Gurneyobert Chauvin, [place] sent at 05/12/2016  7:56 AM EDT ----- Mild elevations of sugar and cholesterol. Thyroid labs are ok, uric acid levels normal. Will see how he is doing on follow up.

## 2016-05-12 NOTE — Telephone Encounter (Signed)
Pt's sister April advised.  She says they need a refill on his thyroid medicine.   Thanks,   -Vernona RiegerLaura

## 2016-05-26 ENCOUNTER — Ambulatory Visit: Payer: Self-pay | Admitting: Family Medicine

## 2016-05-29 ENCOUNTER — Ambulatory Visit (INDEPENDENT_AMBULATORY_CARE_PROVIDER_SITE_OTHER): Payer: Medicare Other | Admitting: Family Medicine

## 2016-05-29 ENCOUNTER — Encounter: Payer: Self-pay | Admitting: Family Medicine

## 2016-05-29 VITALS — BP 96/50 | HR 76 | Temp 97.8°F | Resp 16 | Wt 96.0 lb

## 2016-05-29 DIAGNOSIS — F3289 Other specified depressive episodes: Secondary | ICD-10-CM | POA: Diagnosis not present

## 2016-05-29 NOTE — Progress Notes (Signed)
Subjective:     Patient ID: Jacob Schmidt, male   DOB: 1971-03-07, 45 y.o.   MRN: [id number]  HPI  Chief Complaint  Patient presents with  . Depression    Patient comes in office today for two week follow up, patient last office visit 05/08/16 patient was started on Zologt 50mg . Patients guardian states that there has been a change in mood and patient has been compliant with medication.   States he has been on a whole pill for only a few days.She is getting him to walk more and not stay in his room. Weight is up a 1# from last o.v. Sister reports he did have a day he was in too much pain to walk and required a w/c. However after one nsaid he dramatically improved. ? Attention seeking behavior.   Review of Systems     Objective:   Physical Exam  Constitutional: He appears well-developed and well-nourished. No distress.  Musculoskeletal:  Bilateral knees without swelling or erythema.  FROM       Assessment:    1. Other depression: continue sertraline 50 mg.     Plan:    Phone f/u prior to running out of medication.

## 2016-05-29 NOTE — Patient Instructions (Signed)
Continue sertraline at a whole pill daily. Phone follow up before running out of medication.

## 2017-05-12 ENCOUNTER — Telehealth: Payer: Self-pay

## 2017-05-12 DIAGNOSIS — E039 Hypothyroidism, unspecified: Secondary | ICD-10-CM

## 2017-05-12 NOTE — Telephone Encounter (Signed)
Patients sister (caregiver) states that patient is out of synthroid medication and that Langston Masker request lab work prior to refill. Sister is requesting lab order and refill. KW  CB [phone].

## 2017-05-12 NOTE — Telephone Encounter (Signed)
If she hasn't missed any does then can order TSH and schedule appointment with Nadine Counts. If she has missed any doses, then need to send in 30 day prescription and schedule appointment with bob within the next 2-3 weeks.

## 2017-05-13 NOTE — Telephone Encounter (Signed)
Patient's sister was notified. Sister stated that pt has not missed any doses. Lap slip printed and ready to pick-up.

## 2017-05-14 ENCOUNTER — Other Ambulatory Visit: Payer: Self-pay | Admitting: Family Medicine

## 2017-05-14 DIAGNOSIS — E039 Hypothyroidism, unspecified: Secondary | ICD-10-CM | POA: Diagnosis not present

## 2017-05-14 DIAGNOSIS — K219 Gastro-esophageal reflux disease without esophagitis: Secondary | ICD-10-CM

## 2017-05-14 LAB — TSH: TSH: 0.06 m[IU]/L — AB (ref 0.40–4.50)

## 2017-05-15 ENCOUNTER — Other Ambulatory Visit: Payer: Self-pay | Admitting: Family Medicine

## 2017-05-15 ENCOUNTER — Telehealth: Payer: Self-pay

## 2017-05-15 DIAGNOSIS — E039 Hypothyroidism, unspecified: Secondary | ICD-10-CM

## 2017-05-15 MED ORDER — LEVOTHYROXINE SODIUM 100 MCG PO TABS: ORAL_TABLET | ORAL | 0 refills | Status: DC

## 2017-05-15 NOTE — Telephone Encounter (Signed)
Patient's sister April Billings advised. She states patient is completely out of Levothyroxine 100 mcg. She wanted to know if he should get a refill on the 100 mcg until OV with Nadine Counts or can you send in a new dose and he have labs rechecked in a couple weeks. Please advise.  CB#[phone]

## 2017-05-15 NOTE — Telephone Encounter (Signed)
Advise sister we will refill the Levothyroxine for 30 days and recommend he schedule follow up with Nadine Counts as Dr. Sherrie Mustache recommended on 05-12-17. [facility].

## 2017-05-15 NOTE — Telephone Encounter (Signed)
-----   Message from Malva Limes, MD sent at 05/14/2017  8:47 PM EDT ----- Thyroid functions are way off. Need to schedule appointment with bob to review and discuss adjusting medication.

## 2017-05-15 NOTE — Telephone Encounter (Signed)
Patient's sister April advised. Follow up appointment scheduled for 05/19/17.

## 2017-05-19 ENCOUNTER — Ambulatory Visit (INDEPENDENT_AMBULATORY_CARE_PROVIDER_SITE_OTHER): Payer: Medicare Other | Admitting: Family Medicine

## 2017-05-19 ENCOUNTER — Encounter: Payer: Self-pay | Admitting: Family Medicine

## 2017-05-19 VITALS — BP 102/62 | HR 61 | Temp 97.5°F | Resp 15 | Wt 91.2 lb

## 2017-05-19 DIAGNOSIS — Z23 Encounter for immunization: Secondary | ICD-10-CM

## 2017-05-19 DIAGNOSIS — Q238 Other congenital malformations of aortic and mitral valves: Secondary | ICD-10-CM

## 2017-05-19 DIAGNOSIS — Z13220 Encounter for screening for lipoid disorders: Secondary | ICD-10-CM | POA: Diagnosis not present

## 2017-05-19 DIAGNOSIS — E039 Hypothyroidism, unspecified: Secondary | ICD-10-CM | POA: Diagnosis not present

## 2017-05-19 DIAGNOSIS — Z131 Encounter for screening for diabetes mellitus: Secondary | ICD-10-CM | POA: Diagnosis not present

## 2017-05-19 MED ORDER — LEVOTHYROXINE SODIUM 75 MCG PO TABS: 75.0000 ug | ORAL_TABLET | Freq: Every day | ORAL | 1 refills | Status: DC

## 2017-05-19 NOTE — Patient Instructions (Signed)
Please get labs drawn in 4-6 weeks after he has been on the lower dose of thyroid medication. Consider setting up [facility] cardiology appointment with Dr. Regino Schultze.

## 2017-05-19 NOTE — Progress Notes (Signed)
Subjective:     Patient ID: Jacob Schmidt, male   DOB: 04-23-1971, 46 y.o.   MRN: [id number]  HPI  Chief Complaint  Patient presents with  . Hypothyroidism    Patient returns to office today for follow up after abnormal lab report on 05/14/17 showed a TSH level of 0.06  Accompanied by his Brother-in-law, Kathlene November, who serves as primary historian. States he has been taking one levothyroxine daily. Denies jittery or feeling bad.   Review of Systems  Cardiovascular:       Last saw [facility] cardiologist in 2014. Annual f/u recommended.       Objective:   Physical Exam  Constitutional: He appears well-developed and well-nourished. No distress.  Cardiovascular: Normal rate and regular rhythm.   Pulmonary/Chest: Breath sounds normal.  Musculoskeletal: He exhibits no edema (of lower extremities).       Assessment:    1. Need for influenza vaccination - Flu Vaccine QUAD 36+ mos IM  2. Adult hypothyroidism: will decrease dose - levothyroxine (SYNTHROID) 75 MCG tablet; Take 1 tablet (75 mcg total) by mouth daily before breakfast.  Dispense: 30 tablet; Refill: 1 - T4, free - TSH  3. Cleft leaflet, mitral valve: recommended f/u with [facility] cardiology  4. Screening for diabetes mellitus - COMPLETE METABOLIC PANEL WITH GFR  5. Screening for cholesterol level - Lipid panel    Plan:    Instructed to get labs in 4-6 weeks. Further f/u at that time.

## 2017-06-20 ENCOUNTER — Other Ambulatory Visit: Payer: Self-pay | Admitting: Family Medicine

## 2017-06-20 DIAGNOSIS — E039 Hypothyroidism, unspecified: Secondary | ICD-10-CM

## 2017-07-11 ENCOUNTER — Other Ambulatory Visit: Payer: Self-pay | Admitting: Family Medicine

## 2017-07-11 DIAGNOSIS — E039 Hypothyroidism, unspecified: Secondary | ICD-10-CM

## 2017-07-13 ENCOUNTER — Telehealth: Payer: Self-pay | Admitting: Family Medicine

## 2017-07-13 NOTE — Telephone Encounter (Signed)
Please review chart and advise. KW 

## 2017-07-13 NOTE — Telephone Encounter (Signed)
According to [facility] lab tech labs were in system for 05/14/17 not 05/19/17, labs from 05/19/17 are still good for one year. Called patient sister up to notify her of this and requested that she go take patient to get labs drawn. She verbalized understanding and will be taking patient in the morning 07/14/17 to have labs drawn. KW

## 2017-07-13 NOTE — Telephone Encounter (Signed)
[facility] and that you had adjusted his thyroid medication at the time, she is requesting new order be put in to recheck. KW

## 2017-07-13 NOTE — Telephone Encounter (Signed)
See the lab orders already present. Should be in [facility] queue.

## 2017-07-13 NOTE — Telephone Encounter (Signed)
Pr sister April is requesting a lab slip to have labs rechecked.  WU#[phone]/NFCB#[phone]/MW

## 2017-07-14 DIAGNOSIS — Z13220 Encounter for screening for lipoid disorders: Secondary | ICD-10-CM | POA: Diagnosis not present

## 2017-07-14 DIAGNOSIS — Z131 Encounter for screening for diabetes mellitus: Secondary | ICD-10-CM | POA: Diagnosis not present

## 2017-07-14 DIAGNOSIS — E039 Hypothyroidism, unspecified: Secondary | ICD-10-CM | POA: Diagnosis not present

## 2017-07-14 LAB — COMPLETE METABOLIC PANEL WITH GFR
AG RATIO: 1.3 (calc) (ref 1.0–2.5)
ALBUMIN MSPROF: 4.1 g/dL (ref 3.6–5.1)
ALT: 25 U/L (ref 9–46)
AST: 25 U/L (ref 10–40)
Alkaline phosphatase (APISO): 74 U/L (ref 40–115)
BUN: 22 mg/dL (ref 7–25)
CALCIUM: 9.5 mg/dL (ref 8.6–10.3)
CO2: 30 mmol/L (ref 20–32)
Chloride: 104 mmol/L (ref 98–110)
Creat: 1.34 mg/dL (ref 0.60–1.35)
GFR, EST AFRICAN AMERICAN: 72 mL/min/{1.73_m2} (ref >=60)
GFR, EST NON AFRICAN AMERICAN: 62 mL/min/{1.73_m2} (ref >=60)
GLOBULIN: 3.1 g/dL (ref 1.9–3.7)
Glucose, Bld: 95 mg/dL (ref 65–99)
POTASSIUM: 5.2 mmol/L (ref 3.5–5.3)
SODIUM: 140 mmol/L (ref 135–146)
TOTAL PROTEIN: 7.2 g/dL (ref 6.1–8.1)
Total Bilirubin: 0.5 mg/dL (ref 0.2–1.2)

## 2017-07-14 LAB — LIPID PANEL
Cholesterol: 195 mg/dL (ref <200)
HDL: 48 mg/dL (ref >40)
LDL Cholesterol (Calc): 130 mg/dL (calc) — ABNORMAL HIGH
NON-HDL CHOLESTEROL (CALC): 147 mg/dL — AB (ref <130)
TRIGLYCERIDES: 73 mg/dL (ref <150)
Total CHOL/HDL Ratio: 4.1 (calc) (ref <5.0)

## 2017-07-14 LAB — TSH: TSH: 3.85 mIU/L (ref 0.40–4.50)

## 2017-07-14 LAB — T4, FREE: FREE T4: 1.2 ng/dL (ref 0.8–1.8)

## 2017-07-15 ENCOUNTER — Other Ambulatory Visit: Payer: Self-pay | Admitting: Family Medicine

## 2017-07-15 ENCOUNTER — Other Ambulatory Visit: Payer: Self-pay

## 2017-07-15 DIAGNOSIS — E039 Hypothyroidism, unspecified: Secondary | ICD-10-CM

## 2017-07-15 NOTE — Telephone Encounter (Signed)
[facility] pharmacy has a RX on hold. They will refill and notify patient's sister when ready for pick up.

## 2017-07-15 NOTE — Telephone Encounter (Signed)
I just refilled this in November. Please check with the pharmacy.

## 2017-07-15 NOTE — Telephone Encounter (Signed)
-----   Message from Anola Gurneyobert Chauvin, [place] sent at 07/15/2017  7:32 AM EST ----- Labs look good. Continue current dose of thyroid medication. (Please give information to his guardian, his sister.

## 2017-07-15 NOTE — Telephone Encounter (Signed)
Patient's sister advised of lab results. She is [facility]

## 2017-07-15 NOTE — Telephone Encounter (Signed)
Ok, thanks.

## 2017-08-18 ENCOUNTER — Other Ambulatory Visit: Payer: Self-pay | Admitting: Family Medicine

## 2017-08-18 DIAGNOSIS — K219 Gastro-esophageal reflux disease without esophagitis: Secondary | ICD-10-CM

## 2017-10-12 ENCOUNTER — Other Ambulatory Visit: Payer: Self-pay | Admitting: Family Medicine

## 2017-10-12 ENCOUNTER — Telehealth: Payer: Self-pay | Admitting: Family Medicine

## 2017-10-12 MED ORDER — OSELTAMIVIR PHOSPHATE 75 MG PO CAPS: 75.0000 mg | ORAL_CAPSULE | Freq: Every day | ORAL | 0 refills | Status: DC

## 2017-10-12 NOTE — Telephone Encounter (Signed)
Pt's caregiver Jacob Schmidt stated that pt lives with her and her daughter just tested positive for the flu. Jacob Schmidt is requesting that [facility] [place] [facility] Please advise. Thanks TNP

## 2017-10-12 NOTE — Telephone Encounter (Signed)
Advised April as below.

## 2017-10-12 NOTE — Telephone Encounter (Signed)
Please review. Thanks!  

## 2017-10-12 NOTE — Telephone Encounter (Signed)
Medication sent in. As he has has the flu shot he his at much less risk.

## 2017-10-29 ENCOUNTER — Other Ambulatory Visit: Payer: Self-pay | Admitting: Family Medicine

## 2017-10-29 DIAGNOSIS — E039 Hypothyroidism, unspecified: Secondary | ICD-10-CM

## 2018-01-26 DIAGNOSIS — H35373 Puckering of macula, bilateral: Secondary | ICD-10-CM | POA: Diagnosis not present

## 2018-09-06 ENCOUNTER — Telehealth: Payer: Self-pay | Admitting: Family Medicine

## 2018-09-06 ENCOUNTER — Other Ambulatory Visit: Payer: Self-pay | Admitting: Family Medicine

## 2018-09-06 MED ORDER — OSELTAMIVIR PHOSPHATE 75 MG PO CAPS: 75.0000 mg | ORAL_CAPSULE | Freq: Every day | ORAL | 0 refills | Status: DC

## 2018-09-06 NOTE — Telephone Encounter (Signed)
Advised pt's sister that tamiflu was sent to pharmacy.  Advised for pt to get flu shot after not being ill.  dbs

## 2018-09-06 NOTE — Telephone Encounter (Signed)
I have sent in the Tamiflu. Get the flu shot after he completes the course if not ill.

## 2018-09-06 NOTE — Telephone Encounter (Signed)
Please review

## 2018-09-06 NOTE — Telephone Encounter (Signed)
Jacob Schmidt lives with his sister, husband and children.  One of the children has been diagnosed with flu A through [facility].  Jacob Schmidt (brother-in-law)  wants to know if Jacob Schmidt can get Tamiflu to prevent him from getting it.  Please let Jacob Schmidt know either way.   [facility] Jacob Schmidt

## 2018-10-25 ENCOUNTER — Other Ambulatory Visit: Payer: Self-pay | Admitting: Family Medicine

## 2018-10-25 DIAGNOSIS — K219 Gastro-esophageal reflux disease without esophagitis: Secondary | ICD-10-CM

## 2018-10-25 MED ORDER — RANITIDINE HCL 150 MG PO TABS: 150.0000 mg | ORAL_TABLET | Freq: Every day | ORAL | 0 refills | Status: DC

## 2018-10-25 NOTE — Telephone Encounter (Signed)
Patient's sister advised. She states she is not bringing him in to establish with a new provider until COVID-19 is over.

## 2018-10-25 NOTE — Telephone Encounter (Signed)
Needs to establish with new provider in our office

## 2018-10-25 NOTE — Telephone Encounter (Signed)
[facility] faxed refill request for the following medications: ° °ranitidine (ZANTAC) 150 MG tablet ° ° °Please advise. °

## 2018-12-08 ENCOUNTER — Ambulatory Visit (INDEPENDENT_AMBULATORY_CARE_PROVIDER_SITE_OTHER): Payer: Medicare Other | Admitting: Physician Assistant

## 2018-12-08 ENCOUNTER — Encounter: Payer: Self-pay | Admitting: Physician Assistant

## 2018-12-08 ENCOUNTER — Other Ambulatory Visit: Payer: Self-pay

## 2018-12-08 VITALS — BP 100/70 | HR 76 | Temp 98.0°F | Resp 16 | Wt 101.2 lb

## 2018-12-08 DIAGNOSIS — H6121 Impacted cerumen, right ear: Secondary | ICD-10-CM

## 2018-12-08 DIAGNOSIS — E039 Hypothyroidism, unspecified: Secondary | ICD-10-CM | POA: Diagnosis not present

## 2018-12-08 DIAGNOSIS — L0390 Cellulitis, unspecified: Secondary | ICD-10-CM | POA: Diagnosis not present

## 2018-12-08 MED ORDER — AMOXICILLIN-POT CLAVULANATE 875-125 MG PO TABS: 1.0000 | ORAL_TABLET | Freq: Two times a day (BID) | ORAL | 0 refills | Status: AC

## 2018-12-08 NOTE — Progress Notes (Addendum)
Patient: Jacob Schmidt Male    DOB: May 11, 1971   48 y.o.   MRN: [id number] Visit Date: 12/08/2018  Today's Provider: Trey Sailors, PA-C   Chief Complaint  Patient presents with  . Foot Pain   Subjective:     HPI   Patient presents today for left sided foot pain x 3 days. Patient has Down Syndrome and presents today with his sister who contributes to the history. Patient has a history of gout but this has been in his knee and ankle. Patient has been reporting pain and swelling in his left foot and second toe over the past few days. Sister reports she keeps a close watch on red meat and other triggers for gout. Patient denies injury. He wears cowboy boots but has been wearing these for years without issue.   Sister reports patient has a large clump of wax in his right ear that she has been unable to remove.   Patient has hypothyroidism and is on levothyroxine 75 mcg daily. Taking this without issue.   Lab Results  Component Value Date   TSH 3.85 07/14/2017   No Known Allergies   Current Outpatient Medications:  .  EPINEPHrine (EPIPEN 2-PAK) 0.3 mg/0.3 mL IJ SOAJ injection, , Disp: , Rfl:  .  fluticasone (FLONASE) 50 MCG/ACT nasal spray, 1 puff per nostril daily, Disp: , Rfl:  .  levothyroxine (SYNTHROID, LEVOTHROID) 75 MCG tablet, TAKE 1 TABLET BY MOUTH EVERY DAY BEFORE BREAKFAST, Disp: 90 tablet, Rfl: 3 .  OMEGA-3 FATTY ACIDS PO, Take by mouth., Disp: , Rfl:  .  ranitidine (ZANTAC) 150 MG tablet, Take 1 tablet (150 mg total) by mouth at bedtime., Disp: 90 tablet, Rfl: 0 .  amoxicillin-clavulanate (AUGMENTIN) 875-125 MG tablet, Take 1 tablet by mouth 2 (two) times daily for 7 days., Disp: 14 tablet, Rfl: 0 .  oseltamivir (TAMIFLU) 75 MG capsule, Take 1 capsule (75 mg total) by mouth daily. (Patient not taking: Reported on 12/08/2018), Disp: 10 capsule, Rfl: 0  Review of Systems  Musculoskeletal: Positive for joint swelling.  All other systems reviewed and are  negative.   Social History   Tobacco Use  . Smoking status: Never Smoker  . Smokeless tobacco: Never Used  Substance Use Topics  . Alcohol use: No      Objective:   BP 100/70 (BP Location: Right Arm, Patient Position: Sitting, Cuff Size: Normal)   Pulse 76   Temp 98 F (36.7 C) (Oral)   Resp 16   Wt 101 lb 3.2 oz (45.9 kg)   BMI 21.52 kg/m  Vitals:   12/08/18 1353  BP: 100/70  Pulse: 76  Resp: 16  Temp: 98 F (36.7 C)  TempSrc: Oral  Weight: 101 lb 3.2 oz (45.9 kg)     Physical Exam Constitutional:      Appearance: Normal appearance. He is not ill-appearing.  HENT:     Right Ear: There is impacted cerumen.     Left Ear: There is no impacted cerumen.     Ears:     Comments: Large piece of impacted cerumen removed with cerumen spoon and left TM is normal upon inspection.  Cardiovascular:     Comments: Cap refill < 3 seconds in BLE.  Musculoskeletal:       Feet:  Skin:    General: Skin is warm and dry.  Neurological:     Mental Status: He is alert.  Psychiatric:  Mood and Affect: Mood normal.        Behavior: Behavior normal.         Assessment & Plan    1. Cellulitis, unspecified cellulitis site  Does not appear consistent with gout. Large portion of dorsum of left foot swollen and red, appears more consistent with cellulitis. Will treat - amoxicillin-clavulanate (AUGMENTIN) 875-125 MG tablet; Take 1 tablet by mouth 2 (two) times daily for 7 days.  Dispense: 14 tablet; Refill: 0  2. Adult hypothyroidism  Get TSH today, if stable will refill synthroid x 1 year at current dose. If it requires a change, will adjust and get labs in 2-4 months.  TSH has come back at 8.0. Will need to increase synthroid from 75 mcg daily to 88 mcg daily. F/u 3 mo with office visit and labs.  - TSH  3. Impacted cerumen of right ear  Removed cerumen with good relief of symptoms.  The entirety of the information documented in the History of Present Illness,  Review of Systems and Physical Exam were personally obtained by me. Portions of this information were initially documented by Lexine Baton, LPN and reviewed by me for thoroughness and accuracy.   Return in about 1 year (around 12/08/2019).         Trey Sailors, PA-C  [facility]

## 2018-12-08 NOTE — Patient Instructions (Signed)

## 2018-12-09 LAB — TSH: TSH: 8 u[IU]/mL — ABNORMAL HIGH (ref 0.450–4.500)

## 2018-12-09 MED ORDER — LEVOTHYROXINE SODIUM 88 MCG PO TABS: 88.0000 ug | ORAL_TABLET | Freq: Every day | ORAL | 0 refills | Status: DC

## 2018-12-09 NOTE — Addendum Note (Signed)
Addended by: Trey Sailors on: 12/09/2018 09:48 AM   Modules accepted: Orders, Level of Service

## 2019-01-25 ENCOUNTER — Telehealth: Payer: Self-pay | Admitting: Physician Assistant

## 2019-01-25 DIAGNOSIS — E039 Hypothyroidism, unspecified: Secondary | ICD-10-CM

## 2019-01-25 MED ORDER — LEVOTHYROXINE SODIUM 88 MCG PO TABS
88.0000 ug | ORAL_TABLET | Freq: Every day | ORAL | 0 refills | Status: DC
Start: 2019-01-25 — End: 2019-03-17

## 2019-01-25 NOTE — Telephone Encounter (Signed)
Please review

## 2019-01-25 NOTE — Telephone Encounter (Signed)
[facility] faxed refill request for the following medications:  levothyroxine (SYNTHROID) 88 MCG tablet  90 day supply  Please advise. Thanks TNP

## 2019-03-15 ENCOUNTER — Ambulatory Visit: Payer: Self-pay | Admitting: Physician Assistant

## 2019-03-17 ENCOUNTER — Other Ambulatory Visit: Payer: Self-pay | Admitting: Physician Assistant

## 2019-03-17 DIAGNOSIS — E039 Hypothyroidism, unspecified: Secondary | ICD-10-CM

## 2019-03-17 NOTE — Telephone Encounter (Signed)
Refill one month of synthroid. Please schedule virtual or telephone visit and he can come in for repeat labs when he is ready, need to recheck levels.

## 2019-03-17 NOTE — Telephone Encounter (Signed)
Patient's sister notified. Appointment scheduled.

## 2019-03-19 ENCOUNTER — Other Ambulatory Visit: Payer: Self-pay | Admitting: Physician Assistant

## 2019-03-19 DIAGNOSIS — E039 Hypothyroidism, unspecified: Secondary | ICD-10-CM

## 2019-04-07 ENCOUNTER — Ambulatory Visit (INDEPENDENT_AMBULATORY_CARE_PROVIDER_SITE_OTHER): Payer: Medicare Other | Admitting: Physician Assistant

## 2019-04-07 ENCOUNTER — Encounter: Payer: Self-pay | Admitting: Physician Assistant

## 2019-04-07 VITALS — BP 104/54 | HR 73 | Temp 98.3°F | Wt 98.0 lb

## 2019-04-07 DIAGNOSIS — E039 Hypothyroidism, unspecified: Secondary | ICD-10-CM

## 2019-04-07 NOTE — Patient Instructions (Signed)
Hypothyroidism  Hypothyroidism is when the thyroid gland does not make enough of certain hormones (it is underactive). The thyroid gland is a small gland located in the lower front part of the neck, just in front of the windpipe (trachea). This gland makes hormones that help control how the body uses food for energy (metabolism) as well as how the heart and brain function. These hormones also play a role in keeping your bones strong. When the thyroid is underactive, it produces too little of the hormones thyroxine (T4) and triiodothyronine (T3). What are the causes? This condition may be caused by:  Hashimoto's disease. This is a disease in which the body's disease-fighting system (immune system) attacks the thyroid gland. This is the most common cause.  Viral infections.  Pregnancy.  Certain medicines.  Birth defects.  Past radiation treatments to the head or neck for cancer.  Past treatment with radioactive iodine.  Past exposure to radiation in the environment.  Past surgical removal of part or all of the thyroid.  Problems with a gland in the center of the brain (pituitary gland).  Lack of enough iodine in the diet. What increases the risk? You are more likely to develop this condition if:  You are male.  You have a family history of thyroid conditions.  You use a medicine called lithium.  You take medicines that affect the immune system (immunosuppressants). What are the signs or symptoms? Symptoms of this condition include:  Feeling as though you have no energy (lethargy).  Not being able to tolerate cold.  Weight gain that is not explained by a change in diet or exercise habits.  Lack of appetite.  Dry skin.  Coarse hair.  Menstrual irregularity.  Slowing of thought processes.  Constipation.  Sadness or depression. How is this diagnosed? This condition may be diagnosed based on:  Your symptoms, your medical history, and a physical exam.  Blood  tests. You may also have imaging tests, such as an ultrasound or MRI. How is this treated? This condition is treated with medicine that replaces the thyroid hormones that your body does not make. After you begin treatment, it may take several weeks for symptoms to go away. Follow these instructions at home:  Take over-the-counter and prescription medicines only as told by your health care provider.  If you start taking any new medicines, tell your health care provider.  Keep all follow-up visits as told by your health care provider. This is important. ? As your condition improves, your dosage of thyroid hormone medicine may change. ? You will need to have blood tests regularly so that your health care provider can monitor your condition. Contact a health care provider if:  Your symptoms do not get better with treatment.  You are taking thyroid replacement medicine and you: ? Sweat a lot. ? Have tremors. ? Feel anxious. ? Lose weight rapidly. ? Cannot tolerate heat. ? Have emotional swings. ? Have diarrhea. ? Feel weak. Get help right away if you have:  Chest pain.  An irregular heartbeat.  A rapid heartbeat.  Difficulty breathing. Summary  Hypothyroidism is when the thyroid gland does not make enough of certain hormones (it is underactive).  When the thyroid is underactive, it produces too little of the hormones thyroxine (T4) and triiodothyronine (T3).  The most common cause is Hashimoto's disease, a disease in which the body's disease-fighting system (immune system) attacks the thyroid gland. The condition can also be caused by viral infections, medicine, pregnancy, or past   radiation treatment to the head or neck.  Symptoms may include weight gain, dry skin, constipation, feeling as though you do not have energy, and not being able to tolerate cold.  This condition is treated with medicine to replace the thyroid hormones that your body does not make. This information  is not intended to replace advice given to you by your health care provider. Make sure you discuss any questions you have with your health care provider. Document Released: 07/28/2005 Document Revised: 07/10/2017 Document Reviewed: 07/08/2017 Elsevier Patient Education  2020 [employer]  

## 2019-04-07 NOTE — Progress Notes (Signed)
       Patient: Jacob Schmidt Male    DOB: Oct 04, 1970   48 y.o.   MRN: [id number] Visit Date: 04/07/2019  Today's Provider: Trinna Post, PA-C   No chief complaint on file.  Subjective:    Virtual Visit via Telephone Note  I connected with Jacob Schmidt on 04/07/19 at 10:40 AM EDT by telephone and verified that I am speaking with the correct person using two identifiers.   I discussed the limitations, risks, security and privacy concerns of performing an evaluation and management service by telephone and the availability of in person appointments. I also discussed with the patient that there may be a patient responsible charge related to this service. The patient expressed understanding and agreed to proceed.  Patient location: home Provider location: [place] office  Persons involved in the visit: patient, provider   HPI   Patient with history of Down's Syndrome, visit is conducted with caregiver Jacob Schmidt.   Hypothyroidism:   Last visit levothyroxine increased to 88 mcg daily due to elevated TSH. Rameen is taking this every day, 30 minutes before a meal on an empty stomach with a glass of water. No issues reported, weight stable.  No Known Allergies   Current Outpatient Medications:  .  EPINEPHrine (EPIPEN 2-PAK) 0.3 mg/0.3 mL IJ SOAJ injection, , Disp: , Rfl:  .  fluticasone (FLONASE) 50 MCG/ACT nasal spray, 1 puff per nostril daily, Disp: , Rfl:  .  levothyroxine (SYNTHROID) 88 MCG tablet, TAKE 1 TABLET(88 MCG) BY MOUTH DAILY BEFORE BREAKFAST, Disp: 90 tablet, Rfl: 0 .  OMEGA-3 FATTY ACIDS PO, Take by mouth., Disp: , Rfl:  .  ranitidine (ZANTAC) 150 MG tablet, Take 1 tablet (150 mg total) by mouth at bedtime. (Patient not taking: Reported on 04/07/2019), Disp: 90 tablet, Rfl: 0  Review of Systems  Social History   Tobacco Use  . Smoking status: Never Smoker  . Smokeless tobacco: Never Used  Substance Use Topics  . Alcohol use: No       Objective:   BP (!) 104/54   Pulse 73   Temp 98.3 F (36.8 C)   Wt 98 lb (44.5 kg)   BMI 20.84 kg/m  Vitals:   04/07/19 1033  BP: (!) 104/54  Pulse: 73  Temp: 98.3 F (36.8 C)  Weight: 98 lb (44.5 kg)     Physical Exam   No results found for any visits on 04/07/19.     Assessment & Plan    1. Adult hypothyroidism  Will check lab as below and adjust medication accordingly.   - TSH  The entirety of the information documented in the History of Present Illness, Review of Systems and Physical Exam were personally obtained by me. Portions of this information were initially documented by April M. Sabra Heck, CMA and reviewed by me for thoroughness and accuracy.      Trinna Post, PA-C  [facility]

## 2019-08-07 ENCOUNTER — Other Ambulatory Visit: Payer: Self-pay | Admitting: Physician Assistant

## 2019-08-07 DIAGNOSIS — E039 Hypothyroidism, unspecified: Secondary | ICD-10-CM

## 2019-08-08 NOTE — Telephone Encounter (Signed)
Requested medication (s) are due for refill today: yes  Requested medication (s) are on the active medication list: yes  Last refill:  05/06/2019  Future visit scheduled: no  Notes to clinic:  overdue for labs  Review for refill   Requested Prescriptions  Pending Prescriptions Disp Refills   levothyroxine (SYNTHROID) 88 MCG tablet [Pharmacy Med Name: LEVOTHYROXINE 0.088MG  (88MCG) TAB] 90 tablet 0    Sig: TAKE 1 TABLET(88 MCG) BY MOUTH DAILY BEFORE BREAKFAST      Endocrinology:  Hypothyroid Agents Failed - 08/07/2019 11:17 AM      Failed - TSH needs to be rechecked within 3 months after an abnormal result. Refill until TSH is due.      Failed - TSH in normal range and within 360 days    TSH  Date Value Ref Range Status  12/08/2018 8.000 (H) 0.450 - 4.500 uIU/mL Final          Passed - Valid encounter within last 12 months    Recent Outpatient Visits           4 months ago Adult hypothyroidism   [place], [place], PA-C   8 months ago Cellulitis, unspecified cellulitis site   [place], [place], [place]   2 years ago Need for influenza vaccination   [place], [place]   3 years ago Other depression   [place], [place], [place]   3 years ago Need for influenza vaccination   [facility] [place], [place]

## 2019-08-08 NOTE — Telephone Encounter (Signed)
Pt is due to have his TSH retested.  Tried calling but his voicemail box is full.   Thanks,   -Mickel Baas

## 2019-08-09 NOTE — Telephone Encounter (Signed)
He needs to get his lab and make sure his TSH.

## 2019-11-22 ENCOUNTER — Other Ambulatory Visit: Payer: Self-pay | Admitting: Physician Assistant

## 2019-11-22 NOTE — Telephone Encounter (Signed)
Have reviewed his cardiac conditions. Based on his history, it is not necessary to prescribe preventive antibiotics. These are based on the most recent updated dental guidelines.

## 2019-11-22 NOTE — Telephone Encounter (Signed)
Pts sister called stating that pt is having to make an emergency dental appt for 11/23/19. She states that the pt is supposed to have 2 amoxicillin pills that he takes before the appt. She is requesting to have those sent in for him. Please advise.      [id number] [place], [place] - [address] AT [facility] OF [address]  [address] [place] [address]  Phone: [phone] Fax: [phone]  Not a 24 hour pharmacy; exact hours not known.

## 2019-11-22 NOTE — Telephone Encounter (Signed)
Forwarding note to PCP.

## 2019-11-23 NOTE — Telephone Encounter (Signed)
Attempted to call Sister April Billings. No answer and VM is full. OK for PEC to advise about medication.

## 2020-01-25 NOTE — Progress Notes (Signed)
Subjective:   Jacob Schmidt is a 49 y.o. male who presents for Medicare Annual/Subsequent preventive examination.  I connected with Lenford Beddow today by telephone and verified that I am speaking with the correct person using two identifiers. Location patient: home Location provider: work Persons participating in the virtual visit: patient, provider.   I discussed the limitations, risks, security and privacy concerns of performing an evaluation and management service by telephone and the availability of in person appointments. I also discussed with the patient that there may be a patient responsible charge related to this service. The patient expressed understanding and verbally consented to this telephonic visit.    Interactive audio and video telecommunications were attempted between this provider and patient, however failed, due to patient having technical difficulties OR patient did not have access to video capability.  We continued and completed visit with audio only.  Review of Systems:  N/A  Cardiac Risk Factors include: male gender     Objective:    Vitals: There were no vitals taken for this visit.  There is no height or weight on file to calculate BMI.  Advanced Directives 01/26/2020 05/08/2016  Does Patient Have a Medical Advance Directive? No No  Would patient like information on creating a medical advance directive? No - Patient declined No - patient declined information    Tobacco Social History   Tobacco Use  Smoking Status Never Smoker  Smokeless Tobacco Never Used     Counseling given: Not Answered   Clinical Intake:  Pre-visit preparation completed: Yes  Pain : No/denies pain     Nutritional Risks: None Diabetes: No  How often do you need to have someone help you when you read instructions, pamphlets, or other written materials from your doctor or pharmacy?: 1 - Never  Interpreter Needed?: No  Information entered by :: [facility], LPN  Past  Medical History:  Diagnosis Date  . Thyroid disease    Past Surgical History:  Procedure Laterality Date  . APPENDECTOMY    . ASD REPAIR  1985  . CHOLECYSTECTOMY    . MYRINGOTOMY WITH TUBE PLACEMENT     Family History  Problem Relation Age of Onset  . Dementia Mother    Social History   Socioeconomic History  . Marital status: Single    Spouse name: Not on file  . Number of children: 0  . Years of education: Not on file  . Highest education level: High school graduate  Occupational History  . Not on file  Tobacco Use  . Smoking status: Never Smoker  . Smokeless tobacco: Never Used  Vaping Use  . Vaping Use: Never used  Substance and Sexual Activity  . Alcohol use: No  . Drug use: Not on file  . Sexual activity: Never  Other Topics Concern  . Not on file  Social History Narrative  . Not on file   Social Determinants of Health   Financial Resource Strain: Low Risk   . Difficulty of Paying Living Expenses: Not hard at all  Food Insecurity: No Food Insecurity  . Worried About Charity fundraiser in the Last Year: Never true  . Ran Out of Food in the Last Year: Never true  Transportation Needs: No Transportation Needs  . Lack of Transportation (Medical): No  . Lack of Transportation (Non-Medical): No  Physical Activity: Inactive  . Days of Exercise per Week: 0 days  . Minutes of Exercise per Session: 0 min  Stress: No Stress Concern Present  .  Feeling of Stress : Not at all  Social Connections: Socially Isolated  . Frequency of Communication with Friends and Family: More than three times a week  . Frequency of Social Gatherings with Friends and Family: More than three times a week  . Attends Religious Services: Never  . Active Member of Clubs or Organizations: No  . Attends Archivist Meetings: Never  . Marital Status: Never married    Outpatient Encounter Medications as of 01/26/2020  Medication Sig  . levothyroxine (SYNTHROID) 88 MCG tablet TAKE  1 TABLET(88 MCG) BY MOUTH DAILY BEFORE BREAKFAST  . Multiple Vitamin (MULTIVITAMIN ADULT PO) Take by mouth daily.   . OMEGA-3 FATTY ACIDS PO Take by mouth daily.   Marland Kitchen EPINEPHrine (EPIPEN 2-PAK) 0.3 mg/0.3 mL IJ SOAJ injection  (Patient not taking: Reported on 01/26/2020)  . fluticasone (FLONASE) 50 MCG/ACT nasal spray 1 puff per nostril daily (Patient not taking: Reported on 01/26/2020)  . pantoprazole (PROTONIX) 40 MG tablet Take by mouth. (Patient not taking: Reported on 01/26/2020)  . ranitidine (ZANTAC) 150 MG tablet Take 1 tablet (150 mg total) by mouth at bedtime. (Patient not taking: Reported on 04/07/2019)   No facility-administered encounter medications on file as of 01/26/2020.    Activities of Daily Living In your present state of health, do you have any difficulty performing the following activities: 01/26/2020  Hearing? Y  Comment Does not wear hearing aids. Pt to f/u with PCP on this.  Vision? N  Difficulty concentrating or making decisions? Y  Comment Due to Down Syndrome.  Walking or climbing stairs? Y  Comment Aggrevates gout and causes stress on knee joints and swelling.  Dressing or bathing? N  Doing errands, shopping? Y  Comment Does not drive.  Preparing Food and eating ? N  Comment Only prepares simple, ready-made food.  Using the Toilet? N  In the past six months, have you accidently leaked urine? N  Do you have problems with loss of bowel control? N  Managing your Medications? Y  Comment Sister manages medications.  Managing your Finances? Y  Comment Sister manages finances.  Housekeeping or managing your Housekeeping? N  Some recent data might be hidden    Patient Care Team: Paulene Floor as PCP - General (Physician Assistant) Pa, [facility])   Assessment:   This is a routine wellness examination for Jacob Schmidt.  Exercise Activities and Dietary recommendations Current Exercise Habits: Home exercise routine, Type of exercise: walking,  Time (Minutes): 60, Frequency (Times/Week): 7, Weekly Exercise (Minutes/Week): 420, Intensity: Mild, Exercise limited by: None identified  Goals   None     Fall Risk: Fall Risk  01/26/2020  Falls in the past year? 0  Number falls in past yr: 0  Injury with Fall? 0    FALL RISK PREVENTION PERTAINING TO THE HOME:  Any stairs in or around the home? Yes  If so, are there any without handrails? No   Home free of loose throw rugs in walkways, pet beds, electrical cords, etc? Yes  Adequate lighting in your home to reduce risk of falls? Yes   ASSISTIVE DEVICES UTILIZED TO PREVENT FALLS:  Life alert? No  Use of a cane, walker or w/c? No  Grab bars in the bathroom? No  Shower chair or bench in shower? No  Elevated toilet seat or a handicapped toilet? No   TIMED UP AND GO:  Was the test performed? No .    Depression Screen PHQ 2/9 Scores  01/26/2020 05/19/2017  PHQ - 2 Score 0 0    Cognitive Function: Unable to complete due to speaking with guardian.         Immunization History  Administered Date(s) Administered  . DTP 09/07/1969, 11/27/1969, 11/15/1970, 05/14/1974  . Influenza Split 06/06/2006, 05/17/2011  . Influenza,inj,Quad PF,6+ Mos 05/04/2013, 05/08/2016, 05/19/2017  . MMR 08/31/1970  . OPV 09/07/1969, 11/27/1969, 11/15/1970, 05/14/1974  . Pneumococcal Polysaccharide-23 06/11/1994  . Td 05/14/1974, 05/29/1985, 04/09/1995  . Tdap 05/30/2005, 06/27/2007, 05/17/2011    Qualifies for Shingles Vaccine? No    Tdap: Up to date  Flu Vaccine: Due fall 2021   Screening Tests Health Maintenance  Topic Date Due  . Hepatitis C Screening  Never done  . HIV Screening  Never done  . COLONOSCOPY  12/18/2019  . COVID-19 Vaccine (1) 02/11/2020 (Originally 05/07/1981)  . INFLUENZA VACCINE  03/11/2020  . TETANUS/TDAP  05/16/2021   Cancer Screenings:  Colorectal Screening: Completed 12/17/09. Repeat every 10 years. Sister would like to wait to have this completed next year  when Covid has died down more.  Lung Cancer Screening: (Low Dose CT Chest recommended if Age 53-80 years, 30 pack-year currently smoking OR have quit w/in 15years.) does not qualify.   Additional Screening:  Hepatitis C Screening: Up to date  Vision Screening: Recommended annual ophthalmology exams for early detection of glaucoma and other disorders of the eye.  Dental Screening: Recommended annual dental exams for proper oral hygiene  Community Resource Referral:  CRR required this visit? No      Plan:  I have personally reviewed and addressed the Medicare Annual Wellness questionnaire and have noted the following in the patient's chart:  A. Medical and social history B. Use of alcohol, tobacco or illicit drugs  C. Current medications and supplements D. Functional ability and status E.  Nutritional status F.  Physical activity G. Advance directives H. List of other physicians I.  Hospitalizations, surgeries, and ER visits in previous 12 months J.  [place] such as hearing and vision if needed, cognitive and depression L. Referrals and appointments   In addition, I have reviewed and discussed with patient certain preventive protocols, quality metrics, and best practice recommendations. A written personalized care plan for preventive services as well as general preventive health recommendations were provided to patient.   Glendora Score, [place]  7/71/1657 Nurse Health Advisor   Nurse Notes: Sister is ok having the Hep C and HIV labs added on to next in office blood work orders. Declined a colonoscopy referral at this time.

## 2020-01-26 ENCOUNTER — Ambulatory Visit (INDEPENDENT_AMBULATORY_CARE_PROVIDER_SITE_OTHER): Payer: Medicare Other

## 2020-01-26 ENCOUNTER — Other Ambulatory Visit: Payer: Self-pay

## 2020-01-26 DIAGNOSIS — Z0000 Encounter for general adult medical examination without abnormal findings: Secondary | ICD-10-CM | POA: Diagnosis not present

## 2020-01-26 NOTE — Patient Instructions (Signed)
Jacob Schmidt , Thank you for taking time to come for your Medicare Wellness Visit. I appreciate your ongoing commitment to your health goals. Please review the following plan we discussed and let me know if I can assist you in the future.   Screening recommendations/referrals: Colonoscopy: Currently due. Declined colonoscopy referral at this time. Recommended yearly ophthalmology/optometry visit for glaucoma screening and checkup Recommended yearly dental visit for hygiene and checkup  Vaccinations: Influenza vaccine: Due fall 2021 Tdap vaccine: Up to date, due 05/2021  Advanced directives: Advance directive discussed with you today. Even though you declined this today please call our office should you change your mind and we can give you the proper paperwork for you to fill out.  Conditions/risks identified: None.  Next appointment: 04/10/20 @ 9:00 AM with Osvaldo Angst  Preventive Care 40-64 Years, Male Preventive care refers to lifestyle choices and visits with your health care provider that can promote health and wellness. What does preventive care include?  A yearly physical exam. This is also called an annual well check.  Dental exams once or twice a year.  Routine eye exams. Ask your health care provider how often you should have your eyes checked.  Personal lifestyle choices, including:  Daily care of your teeth and gums.  Regular physical activity.  Eating a healthy diet.  Avoiding tobacco and drug use.  Limiting alcohol use.  Practicing safe sex.  Taking low-dose aspirin every day starting at age 27. What happens during an annual well check? The services and screenings done by your health care provider during your annual well check will depend on your age, overall health, lifestyle risk factors, and family history of disease. Counseling  Your health care provider may ask you questions about your:  Alcohol use.  Tobacco use.  Drug use.  Emotional  well-being.  Home and relationship well-being.  Sexual activity.  Eating habits.  Work and work Astronomer. Screening  You may have the following tests or measurements:  Height, weight, and BMI.  Blood pressure.  Lipid and cholesterol levels. These may be checked every 5 years, or more frequently if you are over 5 years old.  Skin check.  Lung cancer screening. You may have this screening every year starting at age 43 if you have a 30-pack-year history of smoking and currently smoke or have quit within the past 15 years.  Fecal occult blood test (FOBT) of the stool. You may have this test every year starting at age 40.  Flexible sigmoidoscopy or colonoscopy. You may have a sigmoidoscopy every 5 years or a colonoscopy every 10 years starting at age 40.  Prostate cancer screening. Recommendations will vary depending on your family history and other risks.  Hepatitis C blood test.  Hepatitis B blood test.  Sexually transmitted disease (STD) testing.  Diabetes screening. This is done by checking your blood sugar (glucose) after you have not eaten for a while (fasting). You may have this done every 1-3 years. Discuss your test results, treatment options, and if necessary, the need for more tests with your health care provider. Vaccines  Your health care provider may recommend certain vaccines, such as:  Influenza vaccine. This is recommended every year.  Tetanus, diphtheria, and acellular pertussis (Tdap, Td) vaccine. You may need a Td booster every 10 years.  Zoster vaccine. You may need this after age 75.  Pneumococcal 13-valent conjugate (PCV13) vaccine. You may need this if you have certain conditions and have not been vaccinated.  Pneumococcal polysaccharide (  PPSV23) vaccine. You may need one or two doses if you smoke cigarettes or if you have certain conditions. Talk to your health care provider about which screenings and vaccines you need and how often you need  them. This information is not intended to replace advice given to you by your health care provider. Make sure you discuss any questions you have with your health care provider. Document Released: 08/24/2015 Document Revised: 04/16/2016 Document Reviewed: 05/29/2015 Elsevier Interactive Patient Education  2017 [place] Prevention in the Home Falls can cause injuries. They can happen to people of all ages. There are many things you can do to make your home safe and to help prevent falls. What can I do on the outside of my home?  Regularly fix the edges of walkways and driveways and fix any cracks.  Remove anything that might make you trip as you walk through a door, such as a raised step or threshold.  Trim any bushes or trees on the path to your home.  Use bright outdoor lighting.  Clear any walking paths of anything that might make someone trip, such as rocks or tools.  Regularly check to see if handrails are loose or broken. Make sure that both sides of any steps have handrails.  Any raised decks and porches should have guardrails on the edges.  Have any leaves, snow, or ice cleared regularly.  Use sand or salt on walking paths during winter.  Clean up any spills in your garage right away. This includes oil or grease spills. What can I do in the bathroom?  Use night lights.  Install grab bars by the toilet and in the tub and shower. Do not use towel bars as grab bars.  Use non-skid mats or decals in the tub or shower.  If you need to sit down in the shower, use a plastic, non-slip stool.  Keep the floor dry. Clean up any water that spills on the floor as soon as it happens.  Remove soap buildup in the tub or shower regularly.  Attach bath mats securely with double-sided non-slip rug tape.  Do not have throw rugs and other things on the floor that can make you trip. What can I do in the bedroom?  Use night lights.  Make sure that you have a light by your  bed that is easy to reach.  Do not use any sheets or blankets that are too big for your bed. They should not hang down onto the floor.  Have a firm chair that has side arms. You can use this for support while you get dressed.  Do not have throw rugs and other things on the floor that can make you trip. What can I do in the kitchen?  Clean up any spills right away.  Avoid walking on wet floors.  Keep items that you use a lot in easy-to-reach places.  If you need to reach something above you, use a strong step stool that has a grab bar.  Keep electrical cords out of the way.  Do not use floor polish or wax that makes floors slippery. If you must use wax, use non-skid floor wax.  Do not have throw rugs and other things on the floor that can make you trip. What can I do with my stairs?  Do not leave any items on the stairs.  Make sure that there are handrails on both sides of the stairs and use them. Fix handrails that are broken  or loose. Make sure that handrails are as long as the stairways.  Check any carpeting to make sure that it is firmly attached to the stairs. Fix any carpet that is loose or worn.  Avoid having throw rugs at the top or bottom of the stairs. If you do have throw rugs, attach them to the floor with carpet tape.  Make sure that you have a light switch at the top of the stairs and the bottom of the stairs. If you do not have them, ask someone to add them for you. What else can I do to help prevent falls?  Wear shoes that:  Do not have high heels.  Have rubber bottoms.  Are comfortable and fit you well.  Are closed at the toe. Do not wear sandals.  If you use a stepladder:  Make sure that it is fully opened. Do not climb a closed stepladder.  Make sure that both sides of the stepladder are locked into place.  Ask someone to hold it for you, if possible.  Clearly mark and make sure that you can see:  Any grab bars or handrails.  First and last  steps.  Where the edge of each step is.  Use tools that help you move around (mobility aids) if they are needed. These include:  Canes.  Walkers.  Scooters.  Crutches.  Turn on the lights when you go into a dark area. Replace any light bulbs as soon as they burn out.  Set up your furniture so you have a clear path. Avoid moving your furniture around.  If any of your floors are uneven, fix them.  If there are any pets around you, be aware of where they are.  Review your medicines with your doctor. Some medicines can make you feel dizzy. This can increase your chance of falling. Ask your doctor what other things that you can do to help prevent falls. This information is not intended to replace advice given to you by your health care provider. Make sure you discuss any questions you have with your health care provider. Document Released: 05/24/2009 Document Revised: 01/03/2016 Document Reviewed: 09/01/2014 Elsevier Interactive Patient Education  2017 [employer].

## 2020-03-28 ENCOUNTER — Telehealth: Payer: Self-pay

## 2020-03-28 NOTE — Telephone Encounter (Signed)
Patient's sister (April) was advised.

## 2020-03-28 NOTE — Telephone Encounter (Signed)
Yes, I would definitely recommend he get vaccinated. No contraindication for his heart issues.

## 2020-03-28 NOTE — Telephone Encounter (Signed)
Please review. Thanks!  

## 2020-03-28 NOTE — Telephone Encounter (Signed)
Copied from CRM [id number]. Topic: General - Inquiry >> Mar 28, 2020  1:29 PM Deborha Payment wrote: Reason for CRM: Patient sister April called regarding to see if patient would be okay to get first covid vaccine dose.  Patient has heart issues and it wonder if same Call back [phone]`

## 2020-04-04 DIAGNOSIS — Z23 Encounter for immunization: Secondary | ICD-10-CM | POA: Diagnosis not present

## 2020-04-09 NOTE — Progress Notes (Addendum)
Annual Wellness Visit     Patient: Jacob Schmidt, Male    DOB: 07/22/1971, 49 y.o.   MRN: [id number] Visit Date: 04/10/2020  Today's Provider: Trinna Post, PA-C   No chief complaint on file.  Subjective    Jacob Schmidt is a 49 y.o. male who presents today for a follow up to his annual wellness that was done with the nurse health advisor.  First pfizer dose one week ago, second dose already scheduled.   Caregiver reports some increased hearing loss and some memory loss.   Colon cancer screening: colonoscopy 2011. Prep was very difficult.   He reports consuming a general diet. Does exercise  He generally feels well. He reports sleeping well. He does have additional problems to discuss today.   Hypothyroid, follow-up  Lab Results  Component Value Date   TSH 8.000 (H) 12/08/2018   TSH 3.85 07/14/2017   TSH 0.06 (L) 05/14/2017   FREET4 1.2 07/14/2017   FREET4 1.00 05/09/2016   Weight Loss: 8 lb weight loss since last year. Caregiver endorses forgetfulness where he may not prepare his own foods like normally. May not be eating lunch as much. Sister reports he had previously been overweight due to overeating and caregiving difficulties.   Wt Readings from Last 3 Encounters:  04/10/20 90 lb (40.8 kg)  04/07/19 98 lb (44.5 kg)  12/08/18 101 lb 3.2 oz (45.9 kg)    He was last seen for hypothyroid 1 year ago.  Management since that visit includes increase synthroid 88 mcg . He reports excellent compliance with treatment. He is not having side effects.   Symptoms: No change in energy level No constipation  No diarrhea No heat / cold intolerance  No nervousness No palpitations  Yes weight changes    -----------------------------------------------------------------------------------------  Wt Readings from Last 3 Encounters:  04/10/20 90 lb (40.8 kg)  04/07/19 98 lb (44.5 kg)  12/08/18 101 lb 3.2 oz (45.9 kg)     Medications: Outpatient Medications Prior to  Visit  Medication Sig  . EPINEPHrine (EPIPEN 2-PAK) 0.3 mg/0.3 mL IJ SOAJ injection   . fluticasone (FLONASE) 50 MCG/ACT nasal spray 1 puff per nostril daily  . levothyroxine (SYNTHROID) 88 MCG tablet TAKE 1 TABLET(88 MCG) BY MOUTH DAILY BEFORE BREAKFAST  . Multiple Vitamin (MULTIVITAMIN ADULT PO) Take by mouth daily.   . OMEGA-3 FATTY ACIDS PO Take by mouth daily.   . [DISCONTINUED] pantoprazole (PROTONIX) 40 MG tablet Take by mouth. (Patient not taking: Reported on 01/26/2020)  . [DISCONTINUED] ranitidine (ZANTAC) 150 MG tablet Take 1 tablet (150 mg total) by mouth at bedtime. (Patient not taking: Reported on 04/07/2019)   No facility-administered medications prior to visit.    No Known Allergies  Patient Care Team: Paulene Floor as PCP - General (Physician Assistant) Pa, [facility])  Review of Systems  Constitutional: Negative.   HENT: Positive for hearing loss.   Eyes: Negative.   Respiratory: Negative.   Cardiovascular: Negative.   Gastrointestinal: Negative.   Endocrine: Negative.   Genitourinary: Negative.   Musculoskeletal: Negative.   Skin: Negative.   Allergic/Immunologic: Positive for environmental allergies.  Neurological: Negative.   Hematological: Negative.   Psychiatric/Behavioral: Positive for decreased concentration.      Objective    Vitals: BP 118/62 (BP Location: Right Arm, Patient Position: Sitting, Cuff Size: Normal)   Pulse 71   Temp 97.7 F (36.5 C) (Oral)   Ht '4\' 9"'  (1.448 m)  Wt 90 lb (40.8 kg)   SpO2 98%   BMI 19.48 kg/m    Physical Exam Constitutional:      Appearance: Normal appearance. He is normal weight.     Comments: Older appearing than stated age.   HENT:     Head: Normocephalic and atraumatic.     Right Ear: Tympanic membrane, ear canal and external ear normal.     Left Ear: Tympanic membrane, ear canal and external ear normal.     Nose: Nose normal.     Mouth/Throat:     Mouth: Mucous membranes  are moist.     Pharynx: Oropharynx is clear.  Eyes:     Extraocular Movements: Extraocular movements intact.     Conjunctiva/sclera: Conjunctivae normal.     Pupils: Pupils are equal, round, and reactive to light.  Cardiovascular:     Rate and Rhythm: Normal rate and regular rhythm.     Pulses: Normal pulses.     Heart sounds: Normal heart sounds.  Pulmonary:     Effort: Pulmonary effort is normal.     Breath sounds: Normal breath sounds.  Abdominal:     General: Abdomen is flat. Bowel sounds are normal.     Palpations: Abdomen is soft.  Musculoskeletal:        General: Normal range of motion.     Cervical back: Normal range of motion and neck supple.  Skin:    General: Skin is warm and dry.  Neurological:     General: No focal deficit present.     Mental Status: He is alert and oriented to person, place, and time. Mental status is at baseline.  Psychiatric:        Mood and Affect: Mood normal.        Behavior: Behavior normal.        Thought Content: Thought content normal.        Judgment: Judgment normal.     Most recent functional status assessment: In your present state of health, do you have any difficulty performing the following activities: 04/10/2020  Hearing? N  Comment -  Vision? N  Difficulty concentrating or making decisions? N  Comment -  Walking or climbing stairs? N  Comment -  Dressing or bathing? N  Doing errands, shopping? N  Comment -  Preparing Food and eating ? -  Comment -  Using the Toilet? -  In the past six months, have you accidently leaked urine? -  Do you have problems with loss of bowel control? -  Managing your Medications? -  Comment -  Managing your Finances? -  Comment -  Housekeeping or managing your Housekeeping? -  Some recent data might be hidden   Most recent fall risk assessment: Fall Risk  04/10/2020  Falls in the past year? 0  Number falls in past yr: 0  Injury with Fall? 0    Most recent depression screenings: PHQ  2/9 Scores 04/10/2020 01/26/2020  PHQ - 2 Score 0 0  PHQ- 9 Score 0 -   Most recent cognitive screening: No flowsheet data found. Most recent Audit-C alcohol use screening Alcohol Use Disorder Test (AUDIT) 04/10/2020  1. How often do you have a drink containing alcohol? 0  2. How many drinks containing alcohol do you have on a typical day when you are drinking? 0  3. How often do you have six or more drinks on one occasion? 0  AUDIT-C Score 0  Alcohol Brief Interventions/Follow-up AUDIT Score <7 follow-up  not indicated   A score of 3 or more in women, and 4 or more in men indicates increased risk for alcohol abuse, EXCEPT if all of the points are from question 1   No results found for any visits on 04/10/20.  Assessment & Plan     Annual wellness visit done today including the all of the following: Reviewed patient's Family Medical History Reviewed and updated list of patient's medical providers Assessment of cognitive impairment was done Assessed patient's functional ability Established a written schedule for health screening [place] Completed and Reviewed  Exercise Activities and Dietary recommendations Goals   None     Immunization History  Administered Date(s) Administered  . DTP 09/07/1969, 11/27/1969, 11/15/1970, 05/14/1974  . Influenza Split 06/06/2006, 05/17/2011  . Influenza,inj,Quad PF,6+ Mos 05/04/2013, 05/08/2016, 05/19/2017  . MMR 08/31/1970  . OPV 09/07/1969, 11/27/1969, 11/15/1970, 05/14/1974  . PFIZER SARS-COV-2 Vaccination 04/03/2020  . Pneumococcal Polysaccharide-23 06/11/1994  . Td 05/14/1974, 05/29/1985, 04/09/1995  . Tdap 05/30/2005, 06/27/2007, 05/17/2011    Health Maintenance  Topic Date Due  . Hepatitis C Screening  Never done  . HIV Screening  Never done  . COLONOSCOPY  12/18/2019  . INFLUENZA VACCINE  03/11/2020  . COVID-19 Vaccine (2 - Pfizer 2-dose series) 04/24/2020  . TETANUS/TDAP  05/16/2021     Discussed  health benefits of physical activity, and encouraged him to engage in regular exercise appropriate for his age and condition.    1. Adult hypothyroidism  Will check today and adjust medication accordingly.   TSH more elevated than last time. Increase synthroid to 100 mcg daily.   - CBC with Differential/Platelet - Comprehensive metabolic panel - Lipid Panel With LDL/HDL Ratio - TSH  2. Colon cancer screening  - Cologuard  3. Weight loss  In the vicinity of his normal weight range but a little lower. Will check thyroid.   4. Bilateral hearing loss, unspecified hearing loss type  They are existing patients of [facility] and will require about audiology evaluation.    No follow-ups on file.     ITrinna Post, PA-C, have reviewed all documentation for this visit. The documentation on 04/10/20 for the exam, diagnosis, procedures, and orders are all accurate and complete.    Paulene Floor  [facility] [phone] (phone) [phone] (fax)  [place]

## 2020-04-10 ENCOUNTER — Ambulatory Visit (INDEPENDENT_AMBULATORY_CARE_PROVIDER_SITE_OTHER): Payer: Medicare Other | Admitting: Physician Assistant

## 2020-04-10 ENCOUNTER — Other Ambulatory Visit: Payer: Self-pay

## 2020-04-10 VITALS — BP 118/62 | HR 71 | Temp 97.7°F | Ht <= 58 in | Wt 90.0 lb

## 2020-04-10 DIAGNOSIS — Z1211 Encounter for screening for malignant neoplasm of colon: Secondary | ICD-10-CM

## 2020-04-10 DIAGNOSIS — H9193 Unspecified hearing loss, bilateral: Secondary | ICD-10-CM

## 2020-04-10 DIAGNOSIS — R634 Abnormal weight loss: Secondary | ICD-10-CM | POA: Diagnosis not present

## 2020-04-10 DIAGNOSIS — E039 Hypothyroidism, unspecified: Secondary | ICD-10-CM | POA: Diagnosis not present

## 2020-04-10 NOTE — Patient Instructions (Signed)
Shingles - Shingrix - call your insurance company

## 2020-04-11 LAB — CBC WITH DIFFERENTIAL/PLATELET
Basophils Absolute: 0.1 10*3/uL (ref 0.0–0.2)
Basos: 2 %
EOS (ABSOLUTE): 0.1 10*3/uL (ref 0.0–0.4)
Eos: 2 %
Hematocrit: 47 % (ref 37.5–51.0)
Hemoglobin: 16.3 g/dL (ref 13.0–17.7)
Immature Grans (Abs): 0 10*3/uL (ref 0.0–0.1)
Immature Granulocytes: 0 %
Lymphocytes Absolute: 1.1 10*3/uL (ref 0.7–3.1)
Lymphs: 27 %
MCH: 32.5 pg (ref 26.6–33.0)
MCHC: 34.7 g/dL (ref 31.5–35.7)
MCV: 94 fL (ref 79–97)
Monocytes Absolute: 0.5 10*3/uL (ref 0.1–0.9)
Monocytes: 12 %
Neutrophils Absolute: 2.3 10*3/uL (ref 1.4–7.0)
Neutrophils: 57 %
Platelets: 192 10*3/uL (ref 150–450)
RBC: 5.02 x10E6/uL (ref 4.14–5.80)
RDW: 13.1 % (ref 11.6–15.4)
WBC: 4.1 10*3/uL (ref 3.4–10.8)

## 2020-04-11 LAB — COMPREHENSIVE METABOLIC PANEL
ALT: 37 IU/L (ref 0–44)
AST: 36 IU/L (ref 0–40)
Albumin/Globulin Ratio: 1.6 (ref 1.2–2.2)
Albumin: 4.4 g/dL (ref 4.0–5.0)
Alkaline Phosphatase: 80 IU/L (ref 48–121)
BUN/Creatinine Ratio: 16 (ref 9–20)
BUN: 17 mg/dL (ref 6–24)
Bilirubin Total: 0.6 mg/dL (ref 0.0–1.2)
CO2: 27 mmol/L (ref 20–29)
Calcium: 9.7 mg/dL (ref 8.7–10.2)
Chloride: 100 mmol/L (ref 96–106)
Creatinine, Ser: 1.07 mg/dL (ref 0.76–1.27)
GFR calc Af Amer: 93 mL/min/{1.73_m2} (ref >59)
GFR calc non Af Amer: 81 mL/min/{1.73_m2} (ref >59)
Globulin, Total: 2.8 g/dL (ref 1.5–4.5)
Glucose: 74 mg/dL (ref 65–99)
Potassium: 4.3 mmol/L (ref 3.5–5.2)
Sodium: 141 mmol/L (ref 134–144)
Total Protein: 7.2 g/dL (ref 6.0–8.5)

## 2020-04-11 LAB — LIPID PANEL WITH LDL/HDL RATIO
Cholesterol, Total: 205 mg/dL — ABNORMAL HIGH (ref 100–199)
HDL: 45 mg/dL (ref >39)
LDL Chol Calc (NIH): 132 mg/dL — ABNORMAL HIGH (ref 0–99)
LDL/HDL Ratio: 2.9 ratio (ref 0.0–3.6)
Triglycerides: 159 mg/dL — ABNORMAL HIGH (ref 0–149)
VLDL Cholesterol Cal: 28 mg/dL (ref 5–40)

## 2020-04-11 LAB — TSH: TSH: 18.7 u[IU]/mL — ABNORMAL HIGH (ref 0.450–4.500)

## 2020-04-13 ENCOUNTER — Telehealth: Payer: Self-pay

## 2020-04-13 MED ORDER — LEVOTHYROXINE SODIUM 100 MCG PO TABS: 100.0000 ug | ORAL_TABLET | Freq: Every day | ORAL | 3 refills | Status: DC

## 2020-04-13 NOTE — Telephone Encounter (Signed)
Please review. Thanks!  

## 2020-04-13 NOTE — Addendum Note (Signed)
Addended by: Trey Sailors on: 04/13/2020 01:35 PM   Modules accepted: Orders, Level of Service

## 2020-04-13 NOTE — Telephone Encounter (Signed)
Copied from CRM [id number]. Topic: General - Other >> Apr 13, 2020  2:58 PM Jaquita Rector A wrote: Reason for CRM: Patient sister April called to say that she would like a call back from Osvaldo Angst nurse to discuss lab results. Can be reached at Ph# [phone]

## 2020-04-18 ENCOUNTER — Telehealth: Payer: Self-pay

## 2020-04-18 NOTE — Telephone Encounter (Signed)
Called patient's sister (April) and no answer [facility], if April calls back okay for [facility] to advise of message.

## 2020-04-18 NOTE — Telephone Encounter (Signed)
Can we call patient's sister about her concerns? Thanks.

## 2020-04-18 NOTE — Telephone Encounter (Signed)
-----   Message from Trey Sailors, [place] sent at 04/18/2020 12:37 PM EDT ----- Thyroid undercorrected and he needs the next dose up, which I have sent in. Should follow up in 6 weeks for recheck.

## 2020-04-18 NOTE — Telephone Encounter (Signed)
Pt sister, Aprilbgiven lab results per notes of A. Jodi Marble, PA  on 04/18/20. Pt sister verbalized understanding.

## 2020-04-19 NOTE — Telephone Encounter (Signed)
Pt sister was advised of lab results.

## 2020-04-25 DIAGNOSIS — Z23 Encounter for immunization: Secondary | ICD-10-CM | POA: Diagnosis not present

## 2020-05-11 ENCOUNTER — Telehealth: Payer: Self-pay

## 2020-05-11 NOTE — Telephone Encounter (Signed)
pollCopied from CRM [id number]. Topic: General - Other >> May 11, 2020 12:18 PM Lyn Hollingshead D wrote: PT sister asking for some Gout medication / she states they spoke about this on his last visit / please advise

## 2020-05-14 NOTE — Telephone Encounter (Signed)
Would need to evaluate. Can do video visit. Also needs visit to follow up on thyroid.

## 2020-05-15 ENCOUNTER — Ambulatory Visit: Payer: Self-pay | Admitting: *Deleted

## 2020-05-15 NOTE — Telephone Encounter (Signed)
Virtual visit scheduled. Pt sister notified.

## 2020-05-15 NOTE — Telephone Encounter (Signed)
Virtual visit scheduled.  

## 2020-05-15 NOTE — Telephone Encounter (Signed)
Patient's sister April called on Friday requesting medication for gout flare up in elbow that was red, swollen and painful. Patient's sister is requesting to keep medication on hand for future. Patient's sister is aware lab work will be required and is requesting indomethacin ER 75mg  to be prescribed due to she only has 5 tablets left from prescription that patient was prescribed this weekend. Patient's sister would like to set up time for lab work to be completed due to recent OV with  PCP and does not want to take patient out unless necessary due to patient's co morbidities.  Care advise given. Patient's sister verbalized understanding of care advise. Please advise on when to set up for lab work.  Reason for Disposition  Prescription request for new medicine (not a refill)  Answer Assessment - Initial Assessment Questions 1. DRUG NAME: "What medicine do you need to have refilled?"     indomethicin 2. REFILLS REMAINING: "How many refills are remaining?" (Note: The label on the medicine or pill bottle will show how many refills are remaining. If there are no refills remaining, then a renewal may be needed.)     Patient's sister is requesting to have medication on hand in case of another gout flare up 3. EXPIRATION DATE: "What is the expiration date?" (Note: The label states when the prescription will expire, and thus can no longer be refilled.)     na 4. PRESCRIBING HCP: "Who prescribed it?" Reason: If prescribed by specialist, call should be referred to that group.     [facility]  5. SYMPTOMS: "Do you have any symptoms?"     Elbow was swollen and red and painful , could not bend arm  6. PREGNANCY: "Is there any chance that you are pregnant?" "When was your last menstrual period?"     na  Protocols used: MEDICATION REFILL AND RENEWAL CALL-A-AH

## 2020-05-16 ENCOUNTER — Telehealth (INDEPENDENT_AMBULATORY_CARE_PROVIDER_SITE_OTHER): Payer: Medicare Other | Admitting: Physician Assistant

## 2020-05-16 DIAGNOSIS — R238 Other skin changes: Secondary | ICD-10-CM | POA: Diagnosis not present

## 2020-05-16 DIAGNOSIS — E039 Hypothyroidism, unspecified: Secondary | ICD-10-CM | POA: Diagnosis not present

## 2020-05-16 DIAGNOSIS — M25429 Effusion, unspecified elbow: Secondary | ICD-10-CM | POA: Diagnosis not present

## 2020-05-16 MED ORDER — INDOMETHACIN 50 MG PO CAPS
50.0000 mg | ORAL_CAPSULE | Freq: Three times a day (TID) | ORAL | 0 refills | Status: AC
Start: 2020-05-16 — End: 2020-06-15

## 2020-05-16 NOTE — Progress Notes (Signed)
MyChart Video Visit    Virtual Visit via Video Note   This visit type was conducted due to national recommendations for restrictions regarding the COVID-19 Pandemic (e.g. social distancing) in an effort to limit this patient's exposure and mitigate transmission in our community. This patient is at least at moderate risk for complications without adequate follow up. This format is felt to be most appropriate for this patient at this time. Physical exam was limited by quality of the video and audio technology used for the visit.   Patient location: Home Provider location: Office   I discussed the limitations of evaluation and management by telemedicine and the availability of in person appointments. The patient expressed understanding and agreed to proceed.  Patient: Jacob Schmidt   DOB: [birth year]   49 y.o. Male  MRN: [id number] Visit Date: 05/16/2020  Today's healthcare provider: Trey Sailors, PA-C   Chief Complaint  Patient presents with  . Hypothyroidism  . Gout   Subjective    HPI  Hypothyroid, follow-up  Lab Results  Component Value Date   TSH 18.700 (H) 04/10/2020   TSH 8.000 (H) 12/08/2018   TSH 3.85 07/14/2017   FREET4 1.2 07/14/2017   FREET4 1.00 05/09/2016   Wt Readings from Last 3 Encounters:  04/10/20 90 lb (40.8 kg)  04/07/19 98 lb (44.5 kg)  12/08/18 101 lb 3.2 oz (45.9 kg)    He was last seen for hypothyroid 2 months ago.  Management since that visit includes starting levothyroxine daily.  Gout Patient has redness and swelling in his elbow that came on very quickly, and believes it could be gout. Sister reports some red meat but no alcohol or shrimp. Sister reports that a week before, he had a red and painful elbow which she treated with naproxen. She reports he was unable to extend his arm to eat. Sister denies any fever or chills. He is taking indomethacin 75mg , but he will run out of medication.     Medications: Outpatient  Medications Prior to Visit  Medication Sig  . EPINEPHrine (EPIPEN 2-PAK) 0.3 mg/0.3 mL IJ SOAJ injection   . fluticasone (FLONASE) 50 MCG/ACT nasal spray 1 puff per nostril daily  . levothyroxine (SYNTHROID) 100 MCG tablet Take 1 tablet (100 mcg total) by mouth daily.  . Multiple Vitamin (MULTIVITAMIN ADULT PO) Take by mouth daily.   . OMEGA-3 FATTY ACIDS PO Take by mouth daily.    No facility-administered medications prior to visit.    Review of Systems  Constitutional: Negative.   Respiratory: Negative.   Cardiovascular: Negative.   Musculoskeletal: Positive for arthralgias and joint swelling.  Skin: Negative.   Neurological: Negative.       Objective    There were no vitals taken for this visit.   Physical Exam     Assessment & Plan    1. Redness and swelling of elbow  Treated for gout, should also consider bursitis. Will refill indomethacin and check uric acid.  - indomethacin (INDOCIN) 50 MG capsule; Take 1 capsule (50 mg total) by mouth 3 (three) times daily with meals.  Dispense: 30 capsule; Refill: 0 - Uric acid  2. Adult hypothyroidism  - TSH   No follow-ups on file.     I discussed the assessment and treatment plan with the patient. The patient was provided an opportunity to ask questions and all were answered. The patient agreed with the plan and demonstrated an understanding of the instructions.   The patient was  advised to call back or seek an in-person evaluation if the symptoms worsen or if the condition fails to improve as anticipated.   ITrey Sailors, PA-C, have reviewed all documentation for this visit. The documentation on 05/18/20 for the exam, diagnosis, procedures, and orders are all accurate and complete.  The entirety of the information documented in the History of Present Illness, Review of Systems and Physical Exam were personally obtained by me. Portions of this information were initially documented by Anson [place], CMA and  reviewed by me for thoroughness and accuracy.    Maryella Shivers [facility] [phone] (phone) [phone] (fax)  [facility]

## 2020-05-17 DIAGNOSIS — M25429 Effusion, unspecified elbow: Secondary | ICD-10-CM | POA: Diagnosis not present

## 2020-05-17 DIAGNOSIS — E039 Hypothyroidism, unspecified: Secondary | ICD-10-CM | POA: Diagnosis not present

## 2020-05-17 DIAGNOSIS — R238 Other skin changes: Secondary | ICD-10-CM | POA: Diagnosis not present

## 2020-05-18 LAB — URIC ACID: Uric Acid: 5.9 mg/dL (ref 3.8–8.4)

## 2020-05-18 LAB — TSH: TSH: 4.47 u[IU]/mL (ref 0.450–4.500)

## 2020-05-25 DIAGNOSIS — H6123 Impacted cerumen, bilateral: Secondary | ICD-10-CM | POA: Diagnosis not present

## 2020-05-25 DIAGNOSIS — H6061 Unspecified chronic otitis externa, right ear: Secondary | ICD-10-CM | POA: Diagnosis not present

## 2020-05-25 DIAGNOSIS — H903 Sensorineural hearing loss, bilateral: Secondary | ICD-10-CM | POA: Diagnosis not present

## 2020-11-30 DIAGNOSIS — H6123 Impacted cerumen, bilateral: Secondary | ICD-10-CM | POA: Diagnosis not present

## 2020-11-30 DIAGNOSIS — H6063 Unspecified chronic otitis externa, bilateral: Secondary | ICD-10-CM | POA: Diagnosis not present

## 2021-01-31 ENCOUNTER — Ambulatory Visit: Payer: Medicare Other

## 2021-04-01 DIAGNOSIS — H6063 Unspecified chronic otitis externa, bilateral: Secondary | ICD-10-CM | POA: Diagnosis not present

## 2021-04-01 DIAGNOSIS — H6123 Impacted cerumen, bilateral: Secondary | ICD-10-CM | POA: Diagnosis not present

## 2021-04-25 ENCOUNTER — Telehealth: Payer: Self-pay

## 2021-04-25 DIAGNOSIS — E039 Hypothyroidism, unspecified: Secondary | ICD-10-CM

## 2021-04-25 MED ORDER — LEVOTHYROXINE SODIUM 100 MCG PO TABS: 100.0000 ug | ORAL_TABLET | Freq: Every day | ORAL | 0 refills | Status: DC

## 2021-04-25 NOTE — Telephone Encounter (Signed)
Pharmacy is requesting a 90 day supply on this medication.

## 2021-04-25 NOTE — Telephone Encounter (Signed)
[facility] faxed refill request for the following medications:  levothyroxine (SYNTHROID) 100 MCG tablet  Please advise. 

## 2021-04-25 NOTE — Telephone Encounter (Signed)
Patient will need office visit

## 2021-05-27 ENCOUNTER — Other Ambulatory Visit: Payer: Self-pay | Admitting: Family Medicine

## 2021-05-27 DIAGNOSIS — E039 Hypothyroidism, unspecified: Secondary | ICD-10-CM

## 2021-05-28 NOTE — Telephone Encounter (Signed)
Requested medications are due for refill today yes  Requested medications are on the active medication list yes  Last refill 04/25/21  Last visit 05/16/20  Future visit scheduled NO  Notes to clinic Failed protocol due to no valid visit within 12  months, labs within 360 days, no upcoming visit scheduled, and already had a curtesy refill, please assess.   Requested Prescriptions  Pending Prescriptions Disp Refills   levothyroxine (SYNTHROID) 100 MCG tablet [Pharmacy Med Name: LEVOTHYROXINE 0.100MG  ( ) TAB] 90 tablet     Sig: TAKE 1 TABLET(100 MCG) BY MOUTH DAILY     Endocrinology:  Hypothyroid Agents Failed - 05/27/2021  5:06 PM      Failed - TSH needs to be rechecked within 3 months after an abnormal result. Refill until TSH is due.      Failed - TSH in normal range and within 360 days    TSH  Date Value Ref Range Status  05/17/2020 4.470 0.450 - 4.500 uIU/mL Final          Failed - Valid encounter within last 12 months    Recent Outpatient Visits           1 year ago Redness and swelling of elbow   [facility] Trey Sailors, [place]   2 years ago Adult hypothyroidism   [facility] [place], [place], [place]   2 years ago Cellulitis, unspecified cellulitis site   [facility] [place], [place], [place]   4 years ago Need for influenza vaccination   [facility] [place], [place], [place]   5 years ago Other depression   [facility] [place], [place], [place]

## 2021-05-29 ENCOUNTER — Other Ambulatory Visit: Payer: Self-pay

## 2021-05-29 ENCOUNTER — Ambulatory Visit: Payer: Medicare Other | Admitting: Family Medicine

## 2021-05-29 DIAGNOSIS — E039 Hypothyroidism, unspecified: Secondary | ICD-10-CM

## 2021-05-29 NOTE — Progress Notes (Deleted)
      Established patient visit   Patient: Jacob Schmidt   DOB: August 06, 1971   50 y.o. Male  MRN: [id number] Visit Date: 05/29/2021  Today's healthcare provider: Megan Mans, MD   No chief complaint on file.  Subjective    HPI  Hypothyroid, follow-up  Lab Results  Component Value Date   TSH 4.470 05/17/2020   TSH 18.700 (H) 04/10/2020   TSH 8.000 (H) 12/08/2018   FREET4 1.2 07/14/2017   FREET4 1.00 05/09/2016   Wt Readings from Last 3 Encounters:  04/10/20 90 lb (40.8 kg)  04/07/19 98 lb (44.5 kg)  12/08/18 101 lb 3.2 oz (45.9 kg)    He was last seen for hypothyroid 1 years ago.  Management since that visit includes; lab checked showing-TSH back to normal and uric acid normal.  He reports {excellent/good/fair/poor:19665} compliance with treatment. He {is/is not:21021397} having side effects. {document side effects if present:1}  -----------------------------------------------------------------------------------------   {Link to patient history deactivated due to formatting error:1}  Medications: Outpatient Medications Prior to Visit  Medication Sig   EPINEPHrine (EPIPEN 2-PAK) 0.3 mg/0.3 mL IJ SOAJ injection    fluticasone (FLONASE) 50 MCG/ACT nasal spray 1 puff per nostril daily   levothyroxine (SYNTHROID) 100 MCG tablet Take 1 tablet (100 mcg total) by mouth daily before breakfast. Please schedule an office visit before anymore refills.   Multiple Vitamin (MULTIVITAMIN ADULT PO) Take by mouth daily.    OMEGA-3 FATTY ACIDS PO Take by mouth daily.    No facility-administered medications prior to visit.    Review of Systems  Constitutional:  Negative for appetite change, chills and fever.  Respiratory:  Negative for chest tightness, shortness of breath and wheezing.   Cardiovascular:  Negative for chest pain and palpitations.  Gastrointestinal:  Negative for abdominal pain, nausea and vomiting.   {Labs  Heme  Chem  Endocrine  Serology  Results  Review (optional):23779}   Objective    There were no vitals taken for this visit. {Show previous vital signs (optional):23777}  Physical Exam  ***  No results found for any visits on 05/29/21.  Assessment & Plan     ***  No follow-ups on file.      {provider attestation***:1}   Megan Mans, MD  [facility] [phone] (phone) [phone] (fax)  [facility]

## 2021-05-29 NOTE — Telephone Encounter (Signed)
Copied from CRM [id number]. Topic: General - Other >> May 29, 2021  3:07 PM Gaetana Michaelis A wrote: Reason for CRM: The patient has unfortunately had to reschedule their mediation follow up appt for 08/13/21   The patient's sister would like to know if it is possible to request lab orders for a panel so that the patient as well as their PCP can be aware of any issues or concerns related to the patient's prescriptions  Please contact further when possible

## 2021-06-13 NOTE — Telephone Encounter (Addendum)
Medication Refill - Medication: levothyroxine (SYNTHROID) 100 MCG tablet (patient has 1 pill left)  Requesting a short supply)  Has the patient contacted their pharmacy? Yes.    (Agent: If yes, when and what did the pharmacy advise?) Patient would need appointment  Preferred Pharmacy (with phone number or street name):   [id number] [place], [place] - [address] AT [facility] OF [address] [facility] Phone:  [phone]  Fax:  [phone]      Has the patient been seen for an appointment in the last year OR does the patient have an upcoming appointment? Yes   Agent: Please be advised that RX refills may take up to 3 business days. We ask that you follow-up with your pharmacy.

## 2021-06-13 NOTE — Telephone Encounter (Signed)
Requested medication (s) are due for refill today: request courtesy refill   Requested medication (s) are on the active medication list: yes   Last refill:  05/28/21 #15 0 refills   Future visit scheduled: yes in 2 months   Notes to clinic:  can patient get another courtesy refill?  To refill until next appt in 2 months last TSH 05/17/20     Requested Prescriptions  Pending Prescriptions Disp Refills   levothyroxine (SYNTHROID) 100 MCG tablet 15 tablet 0    Sig: Take 1 tablet (100 mcg total) by mouth daily before breakfast. Please schedule an office visit before anymore refills.     Endocrinology:  Hypothyroid Agents Failed - 06/13/2021  1:11 PM      Failed - TSH needs to be rechecked within 3 months after an abnormal result. Refill until TSH is due.      Failed - TSH in normal range and within 360 days    TSH  Date Value Ref Range Status  05/17/2020 4.470 0.450 - 4.500 uIU/mL Final          Failed - Valid encounter within last 12 months    Recent Outpatient Visits           1 year ago Redness and swelling of elbow   [facility] Trey Sailors, [place]   2 years ago Adult hypothyroidism   [facility] Osvaldo Angst M, [place]   2 years ago Cellulitis, unspecified cellulitis site   [facility] [place], [place], [place]   4 years ago Need for influenza vaccination   [facility] [place], [place]   5 years ago Other depression   [facility] [place], [place], [place]       Future Appointments             In 2 months Fisher, Demetrios Isaacs, MD [facility], [facility]

## 2021-06-13 NOTE — Addendum Note (Signed)
Addended by: Wilford Corner on: 06/13/2021 01:11 PM   Modules accepted: Orders

## 2021-06-14 MED ORDER — LEVOTHYROXINE SODIUM 100 MCG PO TABS: 100.0000 ug | ORAL_TABLET | Freq: Every day | ORAL | 0 refills | Status: DC

## 2021-06-14 NOTE — Addendum Note (Signed)
Addended by: Marlene Lard on: 06/14/2021 08:51 AM   Modules accepted: Orders

## 2021-08-01 DIAGNOSIS — H6063 Unspecified chronic otitis externa, bilateral: Secondary | ICD-10-CM | POA: Diagnosis not present

## 2021-08-01 DIAGNOSIS — B379 Candidiasis, unspecified: Secondary | ICD-10-CM | POA: Diagnosis not present

## 2021-08-01 DIAGNOSIS — H6123 Impacted cerumen, bilateral: Secondary | ICD-10-CM | POA: Diagnosis not present

## 2021-08-01 DIAGNOSIS — H903 Sensorineural hearing loss, bilateral: Secondary | ICD-10-CM | POA: Diagnosis not present

## 2021-08-13 ENCOUNTER — Ambulatory Visit: Payer: Medicare Other | Admitting: Family Medicine

## 2021-08-27 ENCOUNTER — Ambulatory Visit (INDEPENDENT_AMBULATORY_CARE_PROVIDER_SITE_OTHER): Payer: Medicare Other | Admitting: Family Medicine

## 2021-08-27 ENCOUNTER — Encounter: Payer: Self-pay | Admitting: Family Medicine

## 2021-08-27 ENCOUNTER — Other Ambulatory Visit: Payer: Self-pay

## 2021-08-27 VITALS — BP 113/61 | HR 60 | Ht <= 58 in | Wt 94.2 lb

## 2021-08-27 DIAGNOSIS — Z1159 Encounter for screening for other viral diseases: Secondary | ICD-10-CM

## 2021-08-27 DIAGNOSIS — Z125 Encounter for screening for malignant neoplasm of prostate: Secondary | ICD-10-CM | POA: Diagnosis not present

## 2021-08-27 DIAGNOSIS — E039 Hypothyroidism, unspecified: Secondary | ICD-10-CM | POA: Diagnosis not present

## 2021-08-27 NOTE — Progress Notes (Signed)
°  ° ° °  Established patient visit   Patient: Jacob Schmidt   DOB: Mar 13, 1971   51 y.o. Male  MRN: [id number] Visit Date: 08/27/2021  Today's healthcare provider: Mila Merry, MD   Chief Complaint  Patient presents with   Hypothyroidism   Subjective    HPI  Hypothyroid, follow-up  Lab Results  Component Value Date   TSH 4.470 05/17/2020   TSH 18.700 (H) 04/10/2020   TSH 8.000 (H) 12/08/2018   FREET4 1.2 07/14/2017   FREET4 1.00 05/09/2016    Wt Readings from Last 3 Encounters:  08/27/21 94 lb 3.2 oz (42.7 kg)  04/10/20 90 lb (40.8 kg)  04/07/19 98 lb (44.5 kg)    He was last seen for hypothyroid on 05/16/2020 via virtual visit.  Management since that visit includes continue same medication. He reports excellent compliance with treatment. He is not having side effects.   Symptoms: No change in energy level No constipation  No diarrhea No heat / cold intolerance  No nervousness No palpitations  No weight changes    -----------------------------------------------------------------------------------------   Medications: Outpatient Medications Prior to Visit  Medication Sig   EPINEPHrine 0.3 mg/0.3 mL IJ SOAJ injection    fluticasone (FLONASE) 50 MCG/ACT nasal spray 1 puff per nostril daily   levothyroxine (SYNTHROID) 100 MCG tablet Take 1 tablet (100 mcg total) by mouth daily before breakfast. Please schedule an office visit before anymore refills.   Multiple Vitamin (MULTIVITAMIN ADULT PO) Take by mouth daily.    OMEGA-3 FATTY ACIDS PO Take by mouth daily.    No facility-administered medications prior to visit.    Review of Systems     Objective    BP 113/61 (BP Location: Right Arm, Patient Position: Sitting, Cuff Size: Normal)    Pulse 60    Ht 4\' 9"  (1.448 m)    Wt 94 lb 3.2 oz (42.7 kg)    SpO2 100%    BMI 20.38 kg/m  {Show previous vital signs (optional):23777}  Physical Exam   General: Appearance:    Well developed, well nourished male in no  acute distress  Eyes:    PERRL, conjunctiva/corneas clear, EOM's intact       Lungs:     Clear to auscultation bilaterally, respirations unlabored  Heart:    Normal heart rate. Normal rhythm. No murmurs, rubs, or gallops.    MS:   All extremities are intact.    Neurologic:   Awake, alert, oriented x 3. No apparent focal neurological defect.          Assessment & Plan     1. Adult hypothyroidism  - TSH  2. Need for hepatitis C screening test  - Hepatitis C antibody  3. Prostate cancer screening  - PSA Total (Reflex To Free) ([facility] only)   He reports he has had  flu vaccine at pharmacy this flu season.      The entirety of the information documented in the History of Present Illness, Review of Systems and Physical Exam were personally obtained by me. Portions of this information were initially documented by the CMA and reviewed by me for thoroughness and accuracy.     , MD  [facility] [phone] (phone) [phone] (fax)  [facility]

## 2021-08-28 LAB — HEPATITIS C ANTIBODY: Hep C Virus Ab: <0.1 s/co ratio (ref 0.0–0.9)

## 2021-08-28 LAB — PSA TOTAL (REFLEX TO FREE): Prostate Specific Ag, Serum: 0.6 ng/mL (ref 0.0–4.0)

## 2021-08-28 LAB — TSH: TSH: 0.021 u[IU]/mL — ABNORMAL LOW (ref 0.450–4.500)

## 2021-09-12 ENCOUNTER — Telehealth: Payer: Self-pay | Admitting: Physician Assistant

## 2021-09-12 ENCOUNTER — Other Ambulatory Visit: Payer: Self-pay

## 2021-09-12 DIAGNOSIS — E039 Hypothyroidism, unspecified: Secondary | ICD-10-CM

## 2021-09-12 MED ORDER — LEVOTHYROXINE SODIUM 88 MCG PO TABS: 88.0000 ug | ORAL_TABLET | Freq: Every day | ORAL | 3 refills | Status: DC

## 2021-09-12 NOTE — Telephone Encounter (Signed)
April called and stated that Pts was suppose to receive a different dose of  levothyroxine (SYNTHROID)/ he takes 0.100 now and was advise that the provider was going to change it to 0.0888/ pts sister called to ask when this will be sent to pharmacy  [id number] [place], [place] - [address] AT [facility] [address] [facility] Phone:  [phone]  Fax:  [phone]

## 2021-09-18 ENCOUNTER — Other Ambulatory Visit: Payer: Self-pay | Admitting: Family Medicine

## 2021-09-18 DIAGNOSIS — E039 Hypothyroidism, unspecified: Secondary | ICD-10-CM

## 2021-10-03 ENCOUNTER — Ambulatory Visit (INDEPENDENT_AMBULATORY_CARE_PROVIDER_SITE_OTHER): Payer: Medicare Other

## 2021-10-03 DIAGNOSIS — Z0000 Encounter for general adult medical examination without abnormal findings: Secondary | ICD-10-CM | POA: Diagnosis not present

## 2021-10-03 NOTE — Patient Instructions (Signed)
Jacob Schmidt , Thank you for taking time to come for your Medicare Wellness Visit. I appreciate your ongoing commitment to your health goals. Please review the following plan we discussed and let me know if I can assist you in the future.   Screening recommendations/referrals: Colonoscopy: n/d Recommended yearly ophthalmology/optometry visit for glaucoma screening and checkup Recommended yearly dental visit for hygiene and checkup  Vaccinations: Influenza vaccine: had this season at local pharmacy Pneumococcal vaccine: 06/11/1994 Tdap vaccine: 05/17/11, due Shingles vaccine: n/d   Covid-19: 04/04/20, 04/25/20  Advanced directives: no  Conditions/risks identified: none  Next appointment: Follow up in one year for your annual wellness visit - 10/07/22 @ 8:20am by phone  Preventive Care 40-64 Years, Male Preventive care refers to lifestyle choices and visits with your health care provider that can promote health and wellness. What does preventive care include? A yearly physical exam. This is also called an annual well check. Dental exams once or twice a year. Routine eye exams. Ask your health care provider how often you should have your eyes checked. Personal lifestyle choices, including: Daily care of your teeth and gums. Regular physical activity. Eating a healthy diet. Avoiding tobacco and drug use. Limiting alcohol use. Practicing safe sex. Taking low-dose aspirin every day starting at age 58. What happens during an annual well check? The services and screenings done by your health care provider during your annual well check will depend on your age, overall health, lifestyle risk factors, and family history of disease. Counseling  Your health care provider may ask you questions about your: Alcohol use. Tobacco use. Drug use. Emotional well-being. Home and relationship well-being. Sexual activity. Eating habits. Work and work Astronomer. Screening  You may have the  following tests or measurements: Height, weight, and BMI. Blood pressure. Lipid and cholesterol levels. These may be checked every 5 years, or more frequently if you are over 22 years old. Skin check. Lung cancer screening. You may have this screening every year starting at age 51 if you have a 30-pack-year history of smoking and currently smoke or have quit within the past 15 years. Fecal occult blood test (FOBT) of the stool. You may have this test every year starting at age 63. Flexible sigmoidoscopy or colonoscopy. You may have a sigmoidoscopy every 5 years or a colonoscopy every 10 years starting at age 107. Prostate cancer screening. Recommendations will vary depending on your family history and other risks. Hepatitis C blood test. Hepatitis B blood test. Sexually transmitted disease (STD) testing. Diabetes screening. This is done by checking your blood sugar (glucose) after you have not eaten for a while (fasting). You may have this done every 1-3 years. Discuss your test results, treatment options, and if necessary, the need for more tests with your health care provider. Vaccines  Your health care provider may recommend certain vaccines, such as: Influenza vaccine. This is recommended every year. Tetanus, diphtheria, and acellular pertussis (Tdap, Td) vaccine. You may need a Td booster every 10 years. Zoster vaccine. You may need this after age 53. Pneumococcal 13-valent conjugate (PCV13) vaccine. You may need this if you have certain conditions and have not been vaccinated. Pneumococcal polysaccharide (PPSV23) vaccine. You may need one or two doses if you smoke cigarettes or if you have certain conditions. Talk to your health care provider about which screenings and vaccines you need and how often you need them. This information is not intended to replace advice given to you by your health care provider. Make sure  you discuss any questions you have with your health care  provider. Document Released: 08/24/2015 Document Revised: 04/16/2016 Document Reviewed: 05/29/2015 Elsevier Interactive Patient Education  2017 [employer].  Fall Prevention in the Home Falls can cause injuries. They can happen to people of all ages. There are many things you can do to make your home safe and to help prevent falls. What can I do on the outside of my home? Regularly fix the edges of walkways and driveways and fix any cracks. Remove anything that might make you trip as you walk through a door, such as a raised step or threshold. Trim any bushes or trees on the path to your home. Use bright outdoor lighting. Clear any walking paths of anything that might make someone trip, such as rocks or tools. Regularly check to see if handrails are loose or broken. Make sure that both sides of any steps have handrails. Any raised decks and porches should have guardrails on the edges. Have any leaves, snow, or ice cleared regularly. Use sand or salt on walking paths during winter. Clean up any spills in your garage right away. This includes oil or grease spills. What can I do in the bathroom? Use night lights. Install grab bars by the toilet and in the tub and shower. Do not use towel bars as grab bars. Use non-skid mats or decals in the tub or shower. If you need to sit down in the shower, use a plastic, non-slip stool. Keep the floor dry. Clean up any water that spills on the floor as soon as it happens. Remove soap buildup in the tub or shower regularly. Attach bath mats securely with double-sided non-slip rug tape. Do not have throw rugs and other things on the floor that can make you trip. What can I do in the bedroom? Use night lights. Make sure that you have a light by your bed that is easy to reach. Do not use any sheets or blankets that are too big for your bed. They should not hang down onto the floor. Have a firm chair that has side arms. You can use this for support while  you get dressed. Do not have throw rugs and other things on the floor that can make you trip. What can I do in the kitchen? Clean up any spills right away. Avoid walking on wet floors. Keep items that you use a lot in easy-to-reach places. If you need to reach something above you, use a strong step stool that has a grab bar. Keep electrical cords out of the way. Do not use floor polish or wax that makes floors slippery. If you must use wax, use non-skid floor wax. Do not have throw rugs and other things on the floor that can make you trip. What can I do with my stairs? Do not leave any items on the stairs. Make sure that there are handrails on both sides of the stairs and use them. Fix handrails that are broken or loose. Make sure that handrails are as long as the stairways. Check any carpeting to make sure that it is firmly attached to the stairs. Fix any carpet that is loose or worn. Avoid having throw rugs at the top or bottom of the stairs. If you do have throw rugs, attach them to the floor with carpet tape. Make sure that you have a light switch at the top of the stairs and the bottom of the stairs. If you do not have them, ask someone  to add them for you. What else can I do to help prevent falls? Wear shoes that: Do not have high heels. Have rubber bottoms. Are comfortable and fit you well. Are closed at the toe. Do not wear sandals. If you use a stepladder: Make sure that it is fully opened. Do not climb a closed stepladder. Make sure that both sides of the stepladder are locked into place. Ask someone to hold it for you, if possible. Clearly mark and make sure that you can see: Any grab bars or handrails. First and last steps. Where the edge of each step is. Use tools that help you move around (mobility aids) if they are needed. These include: Canes. Walkers. Scooters. Crutches. Turn on the lights when you go into a dark area. Replace any light bulbs as soon as they burn  out. Set up your furniture so you have a clear path. Avoid moving your furniture around. If any of your floors are uneven, fix them. If there are any pets around you, be aware of where they are. Review your medicines with your doctor. Some medicines can make you feel dizzy. This can increase your chance of falling. Ask your doctor what other things that you can do to help prevent falls. This information is not intended to replace advice given to you by your health care provider. Make sure you discuss any questions you have with your health care provider. Document Released: 05/24/2009 Document Revised: 01/03/2016 Document Reviewed: 09/01/2014 Elsevier Interactive Patient Education  2017 [employer].

## 2021-10-03 NOTE — Progress Notes (Addendum)
Virtual Visit via Telephone Note  I connected with  Jacob Schmidt on 10/03/21 at  8:20 AM EST by telephone and verified that I am speaking with the correct person using two identifiers.Also spoke with his sister April Billings.  Location: Patient: home Provider: [facility] Persons participating in the virtual visit: [place]   I discussed the limitations, risks, security and privacy concerns of performing an evaluation and management service by telephone and the availability of in person appointments. The patient expressed understanding and agreed to proceed.  Interactive audio and video telecommunications were attempted between this nurse and patient, however failed, due to patient having technical difficulties OR patient did not have access to video capability.  We continued and completed visit with audio only.  Some vital signs may be absent or patient reported.   Dionisio David, LPN  Subjective:   Jacob Schmidt is a 51 y.o. male who presents for Medicare Annual/Subsequent preventive examination.  Review of Systems           Objective:    There were no vitals filed for this visit. There is no height or weight on file to calculate BMI.  Advanced Directives 01/26/2020 05/08/2016  Does Patient Have a Medical Advance Directive? No No  Would patient like information on creating a medical advance directive? No - Patient declined No - patient declined information    Current Medications (verified) Outpatient Encounter Medications as of 10/03/2021  Medication Sig   EPINEPHrine 0.3 mg/0.3 mL IJ SOAJ injection    fluticasone (FLONASE) 50 MCG/ACT nasal spray 1 puff per nostril daily   levothyroxine (SYNTHROID) 88 MCG tablet Take 1 tablet (88 mcg total) by mouth daily before breakfast.   Multiple Vitamin (MULTIVITAMIN ADULT PO) Take by mouth daily.    OMEGA-3 FATTY ACIDS PO Take by mouth daily.    No facility-administered encounter medications on file as of 10/03/2021.     Allergies (verified) Patient has no known allergies.   History: Past Medical History:  Diagnosis Date   Thyroid disease    Past Surgical History:  Procedure Laterality Date   APPENDECTOMY     ASD REPAIR  1985   CHOLECYSTECTOMY     MYRINGOTOMY WITH TUBE PLACEMENT     Family History  Problem Relation Age of Onset   Dementia Mother    Social History   Socioeconomic History   Marital status: Single    Spouse name: Not on file   Number of children: 0   Years of education: Not on file   Highest education level: High school graduate  Occupational History   Not on file  Tobacco Use   Smoking status: Never   Smokeless tobacco: Never  Vaping Use   Vaping Use: Never used  Substance and Sexual Activity   Alcohol use: No   Drug use: Not on file   Sexual activity: Never  Other Topics Concern   Not on file  Social History Narrative   Not on file   Social Determinants of Health   Financial Resource Strain: Not on file  Food Insecurity: Not on file  Transportation Needs: Not on file  Physical Activity: Not on file  Stress: Not on file  Social Connections: Not on file    Tobacco Counseling Counseling given: Not Answered   Clinical Intake:  Pre-visit preparation completed: Yes  Pain : No/denies pain     Nutritional Risks: None Diabetes: No  How often do you need to have someone help you when  you read instructions, pamphlets, or other written materials from your doctor or pharmacy?: 1 - Never  Diabetic?no  Interpreter Needed?: No  Information entered by :: Kirke Shaggy, LPN   Activities of Daily Living In your present state of health, do you have any difficulty performing the following activities: 08/27/2021  Hearing? Y  Vision? Y  Comment wears glasses  Difficulty concentrating or making decisions? Y  Walking or climbing stairs? N  Dressing or bathing? N  Doing errands, shopping? Y  Some recent data might be hidden    Patient Care  Team: Mikey Kirschner, PA-C as PCP - General (Physician Assistant) [facility], [facility])  Indicate any recent Medical Services you may have received from other than [facility] providers in the past year (date may be approximate).     Assessment:   This is a routine wellness examination for [name].  Hearing/Vision screen No results found.  Dietary issues and exercise activities discussed:     Goals Addressed   None    Depression Screen PHQ 2/9 Scores 08/27/2021 04/10/2020 01/26/2020 05/19/2017  PHQ - 2 Score 0 0 0 0  PHQ- 9 Score 0 0 - -    Fall Risk Fall Risk  08/27/2021 04/10/2020 01/26/2020  Falls in the past year? 0 0 0  Number falls in past yr: 0 0 0  Injury with Fall? 0 0 0  Risk for fall due to : No Fall Risks - -    FALL RISK PREVENTION PERTAINING TO THE HOME:  Any stairs in or around the home? Yes  If so, are there any without handrails? No  Home free of loose throw rugs in walkways, pet beds, electrical cords, etc? Yes  Adequate lighting in your home to reduce risk of falls? Yes   ASSISTIVE DEVICES UTILIZED TO PREVENT FALLS:  Life alert? No  Use of a cane, walker or w/c? No  Grab bars in the bathroom? No  Shower chair or bench in shower? No  Elevated toilet seat or a handicapped toilet? No    Cognitive Function:has Down's syndrome        Immunizations Immunization History  Administered Date(s) Administered   DTP 09/07/1969, 11/27/1969, 11/15/1970, 05/14/1974   Influenza Split 06/06/2006, 05/17/2011   Influenza,inj,Quad PF,6+ Mos 05/04/2013, 05/08/2016, 05/19/2017   MMR 08/31/1970   OPV 09/07/1969, 11/27/1969, 11/15/1970, 05/14/1974   PFIZER(Purple Top)SARS-COV-2 Vaccination 04/04/2020, 04/25/2020   Pneumococcal Polysaccharide-23 06/11/1994   Td 05/14/1974, 05/29/1985, 04/09/1995   Tdap 05/30/2005, 06/27/2007, 05/17/2011    TDAP status: Due, Education has been provided regarding the importance of this vaccine. Advised may receive this vaccine  at local pharmacy or Health Dept. Aware to provide a copy of the vaccination record if obtained from local pharmacy or Health Dept. Verbalized acceptance and understanding.  Flu Vaccine status: Up to date  Pneumococcal vaccine status: Up to date  Covid-19 vaccine status: Completed vaccines  Qualifies for Shingles Vaccine? No   Zostavax completed No   Shingrix Completed?: No.    Education has been provided regarding the importance of this vaccine. Patient has been advised to call insurance company to determine out of pocket expense if they have not yet received this vaccine. Advised may also receive vaccine at local pharmacy or Health Dept. Verbalized acceptance and understanding.  Screening Tests Health Maintenance  Topic Date Due   HIV Screening  Never done   Zoster Vaccines- Shingrix (1 of 2) Never done   COLONOSCOPY (Pts 45-12yr Insurance coverage will need to be confirmed)  12/18/2019   COVID-19 Vaccine (3 - Booster for Pfizer series) 06/20/2020   TETANUS/TDAP  05/16/2021   INFLUENZA VACCINE  11/08/2021 (Originally 03/11/2021)   Hepatitis C Screening  Completed   HPV VACCINES  Aged Out    Health Maintenance  Health Maintenance Due  Topic Date Due   HIV Screening  Never done   Zoster Vaccines- Shingrix (1 of 2) Never done   COLONOSCOPY (Pts 45-17yr Insurance coverage will need to be confirmed)  12/18/2019   COVID-19 Vaccine (3 - Booster for Pfizer series) 06/20/2020   TETANUS/TDAP  05/16/2021    Colorectal cancer screening: No longer required. - talked to MD about this, they decided not to  Lung Cancer Screening: (Low Dose CT Chest recommended if Age 53-80years, 30 pack-year currently smoking OR have quit w/in 15years.) does not qualify.    Additional Screening:  Hepatitis C Screening: does not qualify; Completed no  Vision Screening: Recommended annual ophthalmology exams for early detection of glaucoma and other disorders of the eye. Is the patient up to date with  their annual eye exam?  Yes  Who is the provider or what is the name of the office in which the patient attends annual eye exams[facility] is not established with a provider, would they like to be referred to a provider to establish care? No .   Dental Screening: Recommended annual dental exams for proper oral hygiene  Community Resource Referral / Chronic Care Management: CRR required this visit?  No   CCM required this visit?  No      Plan:     I have personally reviewed and noted the following in the patient's chart:   Medical and social history Use of alcohol, tobacco or illicit drugs  Current medications and supplements including opioid prescriptions. Patient is not currently taking opioid prescriptions. Functional ability and status Nutritional status Physical activity Advanced directives List of other physicians Hospitalizations, surgeries, and ER visits in previous 12 months Vitals Screenings to include cognitive, depression, and falls Referrals and appointments  In addition, I have reviewed and discussed with patient certain preventive protocols, quality metrics, and best practice recommendations. A written personalized care plan for preventive services as well as general preventive health recommendations were provided to patient.     LDionisio David LPN   [id number]  Nurse Notes: none

## 2021-10-24 NOTE — Progress Notes (Signed)
? ?I,Sha'taria Tyson,acting as a Education administrator for [employer], PA-C.,have documented all relevant documentation on the behalf of Mikey Kirschner, PA-C,as directed by  Mikey Kirschner, PA-C while in the presence of Mikey Kirschner, PA-C. ? ?Established Patient Office Visit ? ?Subjective:  ?Patient ID: Jacob Schmidt, male    DOB: 04/19/1971  Age: 51 y.o. MRN: [id number] ? ? ? ?HPI ?Jacob Schmidt presents for 2-3 month thyroid follow-up. He is accompanied today by his sister and guardian, April. ? ?Hypothyroid, follow-up ? ?Lab Results  ?Component Value Date  ? TSH 0.021 (L) 08/27/2021  ? TSH 4.470 05/17/2020  ? TSH 18.700 (H) 04/10/2020  ? FREET4 1.2 07/14/2017  ? FREET4 1.00 05/09/2016  ? ? ?Wt Readings from Last 3 Encounters:  ?08/27/21 94 lb 3.2 oz (42.7 kg)  ?04/10/20 90 lb (40.8 kg)  ?04/07/19 98 lb (44.5 kg)  ? ? ?He was last seen for hypothyroid 2 months ago.  ?Management since that visit includes none. ?He reports excellent compliance with treatment. ?He is not having side effects.  ? ?Symptoms: ?No change in energy level No constipation  ?No diarrhea No heat / cold intolerance  ?No nervousness No palpitations  ?No weight changes   ? ?----------------------------------------------------------------------------------------- ? ? ?Past Medical History:  ?Diagnosis Date  ? Thyroid disease   ? ? ?Past Surgical History:  ?Procedure Laterality Date  ? APPENDECTOMY    ? ASD REPAIR  1985  ? CHOLECYSTECTOMY    ? MYRINGOTOMY WITH TUBE PLACEMENT    ? ? ?Family History  ?Problem Relation Age of Onset  ? Dementia Mother   ? ? ?Social History  ? ?Socioeconomic History  ? Marital status: Single  ?  Spouse name: Not on file  ? Number of children: 0  ? Years of education: Not on file  ? Highest education level: High school graduate  ?Occupational History  ? Not on file  ?Tobacco Use  ? Smoking status: Never  ? Smokeless tobacco: Never  ?Vaping Use  ? Vaping Use: Never used  ?Substance and Sexual Activity  ? Alcohol use: No  ? Drug  use: Not on file  ? Sexual activity: Never  ?Other Topics Concern  ? Not on file  ?Social History Narrative  ? Not on file  ? ?Social Determinants of Health  ? ?Financial Resource Strain: Low Risk   ? Difficulty of Paying Living Expenses: Not hard at all  ?Food Insecurity: No Food Insecurity  ? Worried About Charity fundraiser in the Last Year: Never true  ? Ran Out of Food in the Last Year: Never true  ?Transportation Needs: No Transportation Needs  ? Lack of Transportation (Medical): No  ? Lack of Transportation (Non-Medical): No  ?Physical Activity: Sufficiently Active  ? Days of Exercise per Week: 4 days  ? Minutes of Exercise per Session: 40 min  ?Stress: No Stress Concern Present  ? Feeling of Stress : Not at all  ?Social Connections: Moderately Isolated  ? Frequency of Communication with Friends and Family: More than three times a week  ? Frequency of Social Gatherings with Friends and Family: More than three times a week  ? Attends Religious Services: More than 4 times per year  ? Active Member of Clubs or Organizations: No  ? Attends Archivist Meetings: Never  ? Marital Status: Never married  ?Intimate Partner Violence: Not At Risk  ? Fear of Current or Ex-Partner: No  ? Emotionally Abused: No  ? Physically Abused: No  ?  Sexually Abused: No  ? ? ?Outpatient Medications Prior to Visit  ?Medication Sig Dispense Refill  ? EPINEPHrine 0.3 mg/0.3 mL IJ SOAJ injection     ? fluticasone (FLONASE) 50 MCG/ACT nasal spray 1 puff per nostril daily    ? levothyroxine (SYNTHROID) 88 MCG tablet Take 1 tablet (88 mcg total) by mouth daily before breakfast. 30 tablet 3  ? Multiple Vitamin (MULTIVITAMIN ADULT PO) Take by mouth daily.     ? OMEGA-3 FATTY ACIDS PO Take by mouth daily.     ? ?No facility-administered medications prior to visit.  ? ? ?No Known Allergies ? ?ROS ?Review of Systems  ?Constitutional:  Negative for fatigue and fever.  ?Respiratory:  Negative for cough and shortness of breath.    ?Cardiovascular:  Negative for chest pain, palpitations and leg swelling.  ?Neurological:  Negative for dizziness and headaches.  ? ?  ?Objective:  ?  ?Physical Exam ?Constitutional:   ?   Appearance: Normal appearance. He is not ill-appearing.  ?HENT:  ?   Head: Normocephalic.  ?Eyes:  ?   Conjunctiva/sclera: Conjunctivae normal.  ?Cardiovascular:  ?   Rate and Rhythm: Normal rate and regular rhythm.  ?   Pulses: Normal pulses.  ?Pulmonary:  ?   Effort: Pulmonary effort is normal.  ?   Breath sounds: Normal breath sounds.  ?Neurological:  ?   Mental Status: He is oriented to person, place, and time.  ?Psychiatric:     ?   Mood and Affect: Mood normal.     ?   Behavior: Behavior normal.  ? ? ?There were no vitals taken for this visit. ?Wt Readings from Last 3 Encounters:  ?08/27/21 94 lb 3.2 oz (42.7 kg)  ?04/10/20 90 lb (40.8 kg)  ?04/07/19 98 lb (44.5 kg)  ? ? ? ?Health Maintenance Due  ?Topic Date Due  ? HIV Screening  Never done  ? Zoster Vaccines- Shingrix (1 of 2) Never done  ? COLONOSCOPY (Pts 45-40yrs Insurance coverage will need to be confirmed)  12/18/2019  ? COVID-19 Vaccine (3 - Booster for Pfizer series) 06/20/2020  ? TETANUS/TDAP  05/16/2021  ? ? ?There are no preventive care reminders to display for this patient. ? ?Lab Results  ?Component Value Date  ? TSH 0.021 (L) 08/27/2021  ? ?Lab Results  ?Component Value Date  ? WBC 4.1 04/10/2020  ? HGB 16.3 04/10/2020  ? HCT 47.0 04/10/2020  ? MCV 94 04/10/2020  ? PLT 192 04/10/2020  ? ?Lab Results  ?Component Value Date  ? NA 141 04/10/2020  ? K 4.3 04/10/2020  ? CO2 27 04/10/2020  ? GLUCOSE 74 04/10/2020  ? BUN 17 04/10/2020  ? CREATININE 1.07 04/10/2020  ? BILITOT 0.6 04/10/2020  ? ALKPHOS 80 04/10/2020  ? AST 36 04/10/2020  ? ALT 37 04/10/2020  ? PROT 7.2 04/10/2020  ? ALBUMIN 4.4 04/10/2020  ? CALCIUM 9.7 04/10/2020  ? ANIONGAP 5 (L) 07/12/2012  ? ?Lab Results  ?Component Value Date  ? CHOL 205 (H) 04/10/2020  ? ?Lab Results  ?Component Value Date  ?  HDL 45 04/10/2020  ? ?Lab Results  ?Component Value Date  ? LDLCALC 132 (H) 04/10/2020  ? ?Lab Results  ?Component Value Date  ? TRIG 159 (H) 04/10/2020  ? ?Lab Results  ?Component Value Date  ? CHOLHDL 4.1 07/14/2017  ? ?No results found for: HGBA1C ? ?  ?Assessment & Plan:  ? ?Problem List Items Addressed This Visit   ? ?  ?  Endocrine  ? Adult hypothyroidism - Primary  ?  Asymptomatic ?Continue levothyroxine 88 mcg ?Will check tsh/t4 ?  ?  ? Relevant Orders  ? TSH + free T4  ? ?Other Visit Diagnoses   ? ? Moderate mixed hyperlipidemia not requiring statin therapy      ? Relevant Orders  ? Lipid Profile  ? Comprehensive Metabolic Panel (CMET)  ? ?  ?  ? ?I, Mikey Kirschner, PA-C have reviewed all documentation for this visit. The documentation on  10/25/2021 for the exam, diagnosis, procedures, and orders are all accurate and complete. ? ?Mikey Kirschner, PA-C ?[facility] ?[id number] ?[place], [place], [address] ?Office: [phone] ?Fax: [phone]  ?

## 2021-10-25 ENCOUNTER — Encounter: Payer: Self-pay | Admitting: Physician Assistant

## 2021-10-25 ENCOUNTER — Ambulatory Visit (INDEPENDENT_AMBULATORY_CARE_PROVIDER_SITE_OTHER): Payer: Medicare Other | Admitting: Physician Assistant

## 2021-10-25 ENCOUNTER — Other Ambulatory Visit: Payer: Self-pay

## 2021-10-25 VITALS — BP 109/58 | HR 62 | Ht <= 58 in | Wt 93.0 lb

## 2021-10-25 DIAGNOSIS — E782 Mixed hyperlipidemia: Secondary | ICD-10-CM | POA: Insufficient documentation

## 2021-10-25 DIAGNOSIS — E039 Hypothyroidism, unspecified: Secondary | ICD-10-CM | POA: Diagnosis not present

## 2021-10-25 NOTE — Assessment & Plan Note (Signed)
Reviewed previous cmp and lipid panel ?Not currently medication ?Sister reports significant diet changes since then  ?Will recheck lipid panel/cmp ?

## 2021-10-25 NOTE — Assessment & Plan Note (Signed)
Asymptomatic ?Continue levothyroxine 88 mcg ?Will check tsh/t4 ?

## 2021-10-26 LAB — COMPREHENSIVE METABOLIC PANEL
ALT: 27 IU/L (ref 0–44)
AST: 25 IU/L (ref 0–40)
Albumin/Globulin Ratio: 1.3 (ref 1.2–2.2)
Albumin: 4 g/dL (ref 3.8–4.9)
Alkaline Phosphatase: 95 IU/L (ref 44–121)
BUN/Creatinine Ratio: 18 (ref 9–20)
BUN: 21 mg/dL (ref 6–24)
Bilirubin Total: 0.4 mg/dL (ref 0.0–1.2)
CO2: 27 mmol/L (ref 20–29)
Calcium: 9 mg/dL (ref 8.7–10.2)
Chloride: 103 mmol/L (ref 96–106)
Creatinine, Ser: 1.15 mg/dL (ref 0.76–1.27)
Globulin, Total: 3 g/dL (ref 1.5–4.5)
Glucose: 91 mg/dL (ref 70–99)
Potassium: 4.6 mmol/L (ref 3.5–5.2)
Sodium: 141 mmol/L (ref 134–144)
Total Protein: 7 g/dL (ref 6.0–8.5)
eGFR: 77 mL/min/{1.73_m2} (ref >59)

## 2021-10-26 LAB — TSH+FREE T4
Free T4: 1.71 ng/dL (ref 0.82–1.77)
TSH: 0.045 u[IU]/mL — ABNORMAL LOW (ref 0.450–4.500)

## 2021-10-26 LAB — LIPID PANEL
Chol/HDL Ratio: 4.1 ratio (ref 0.0–5.0)
Cholesterol, Total: 171 mg/dL (ref 100–199)
HDL: 42 mg/dL (ref >39)
LDL Chol Calc (NIH): 107 mg/dL — ABNORMAL HIGH (ref 0–99)
Triglycerides: 120 mg/dL (ref 0–149)
VLDL Cholesterol Cal: 22 mg/dL (ref 5–40)

## 2021-10-28 ENCOUNTER — Other Ambulatory Visit: Payer: Self-pay | Admitting: Physician Assistant

## 2021-10-28 DIAGNOSIS — E039 Hypothyroidism, unspecified: Secondary | ICD-10-CM

## 2021-10-28 MED ORDER — LEVOTHYROXINE SODIUM 75 MCG PO TABS: 75.0000 ug | ORAL_TABLET | Freq: Every day | ORAL | 1 refills | Status: DC

## 2021-10-29 ENCOUNTER — Telehealth: Payer: Self-pay | Admitting: *Deleted

## 2021-10-29 NOTE — Telephone Encounter (Signed)
Pts sister called wanting lab results. Results given.verbalized understanding ?

## 2022-05-13 ENCOUNTER — Other Ambulatory Visit: Payer: Self-pay | Admitting: Physician Assistant

## 2022-05-13 DIAGNOSIS — E039 Hypothyroidism, unspecified: Secondary | ICD-10-CM

## 2022-05-16 ENCOUNTER — Telehealth: Payer: Self-pay

## 2022-05-16 NOTE — Telephone Encounter (Signed)
Sister advised. Verbalized understanding and states patient has been feeling fine no concerns. Appt cancelled

## 2022-05-16 NOTE — Telephone Encounter (Signed)
Copied from [place]. Topic: General - Inquiry >> May 16, 2022  8:26 AM Chapman Fitch wrote: Reason for CRM: April wanted to check with Ria Comment to see if Andoni needs to just do blood work to recheck thyroid or if the appt for 10.13.23 with Ria Comment to follow up is needed / please advise

## 2022-05-23 ENCOUNTER — Ambulatory Visit: Payer: Medicare Other | Admitting: Physician Assistant

## 2022-06-12 ENCOUNTER — Other Ambulatory Visit: Payer: Self-pay

## 2022-06-12 DIAGNOSIS — E039 Hypothyroidism, unspecified: Secondary | ICD-10-CM

## 2022-06-13 ENCOUNTER — Other Ambulatory Visit: Payer: Self-pay | Admitting: *Deleted

## 2022-06-13 DIAGNOSIS — E039 Hypothyroidism, unspecified: Secondary | ICD-10-CM

## 2022-06-13 LAB — TSH+FREE T4
Free T4: 1.29 ng/dL (ref 0.82–1.77)
TSH: 5.39 u[IU]/mL — ABNORMAL HIGH (ref 0.450–4.500)

## 2022-10-07 ENCOUNTER — Telehealth: Payer: Self-pay

## 2022-10-07 ENCOUNTER — Encounter: Payer: Medicare Other | Admitting: Physician Assistant

## 2022-10-07 NOTE — Progress Notes (Deleted)
I,Sha'taria Wendle Kina,acting as a Education administrator for [employer], PA-C.,have documented all relevant documentation on the behalf of Mikey Kirschner, PA-C,as directed by  Mikey Kirschner, PA-C while in the presence of Mikey Kirschner, PA-C.   Annual Wellness Visit     Patient: Jacob Schmidt, Male    DOB: 10/06/1970, 52 y.o.   MRN: [id number] Visit Date: 10/07/2022  Today's Provider: Mikey Kirschner, PA-C   No chief complaint on file.  Subjective    Jacob Schmidt is a 52 y.o. male who presents today for his Annual Wellness Visit. He reports consuming a {diet types:17450} diet. {Exercise:19826} He generally feels {well/fairly well/poorly:18703}. He reports sleeping {well/fairly well/poorly:18703}. He {does/does not:200015} have additional problems to discuss today.   HPI -influenza Vaccine: -Shingles Vaccine: -Tetanus Vaccine: -HIV Screening: -Covid Vaccine: -AWV: Last 10/03/21 Medications: Outpatient Medications Prior to Visit  Medication Sig   EPINEPHrine 0.3 mg/0.3 mL IJ SOAJ injection    fluticasone (FLONASE) 50 MCG/ACT nasal spray 1 puff per nostril daily   levothyroxine (SYNTHROID) 75 MCG tablet TAKE 1 TABLET(75 MCG) BY MOUTH DAILY BEFORE BREAKFAST   Multiple Vitamin (MULTIVITAMIN ADULT PO) Take by mouth daily.    OMEGA-3 FATTY ACIDS PO Take by mouth daily.    No facility-administered medications prior to visit.    No Known Allergies  Patient Care Team: Mikey Kirschner, PA-C as PCP - General (Physician Assistant) Pa, [place] (Optometry)  Review of Systems      Objective    Vitals: There were no vitals taken for this visit.   Physical Exam ***  Most recent functional status assessment:     No data to display         Most recent fall risk assessment:    10/03/2021    8:19 AM  [place] in the past year? 0  Number falls in past yr: 0  Injury with Fall? 0  Risk for fall due to : No Fall Risks  Follow up Falls evaluation completed     Most recent depression screenings:    10/03/2021    8:17 AM 08/27/2021   11:06 AM  PHQ 2/9 Scores  PHQ - 2 Score 0 0  PHQ- 9 Score 0 0   Most recent cognitive screening:     No data to display         Most recent Audit-C alcohol use screening    10/03/2021    8:16 AM  Alcohol Use Disorder Test (AUDIT)  1. How often do you have a drink containing alcohol? 0  2. How many drinks containing alcohol do you have on a typical day when you are drinking? 0  3. How often do you have six or more drinks on one occasion? 0  AUDIT-C Score 0   A score of 3 or more in women, and 4 or more in men indicates increased risk for alcohol abuse, EXCEPT if all of the points are from question 1   No results found for any visits on 10/07/22.  Assessment & Plan     Annual wellness visit done today including the all of the following: Reviewed patient's Family Medical History Reviewed and updated list of patient's medical providers Assessment of cognitive impairment was done Assessed patient's functional ability Established a written schedule for health screening [place] Completed and Reviewed  Exercise Activities and Dietary recommendations --balanced diet high in fiber and protein, low in sugars, carbs, fats. --physical activity/exercise 30 minutes 3-5 times  a week     Immunization History  Administered Date(s) Administered   DTP 09/07/1969, 11/27/1969, 11/15/1970, 05/14/1974   Influenza Split 06/06/2006, 05/17/2011   Influenza,inj,Quad PF,6+ Mos 05/04/2013, 05/08/2016, 05/19/2017   MMR 08/31/1970   OPV 09/07/1969, 11/27/1969, 11/15/1970, 05/14/1974   PFIZER(Purple Top)SARS-COV-2 Vaccination 04/04/2020, 04/25/2020   Pneumococcal Polysaccharide-23 06/11/1994   Td 05/14/1974, 05/29/1985, 04/09/1995   Tdap 05/30/2005, 06/27/2007, 05/17/2011    Health Maintenance  Topic Date Due   HIV Screening  Never done   Zoster Vaccines- Shingrix (1 of 2) Never done    DTaP/Tdap/Td (10 - Td or Tdap) 05/16/2021   INFLUENZA VACCINE  03/11/2022   COVID-19 Vaccine (3 - 2023-24 season) 04/11/2022   Medicare Annual Wellness (AWV)  10/03/2022   COLONOSCOPY (Pts 45-23yr Insurance coverage will need to be confirmed)  10/26/2022 (Originally 12/18/2019)   Hepatitis C Screening  Completed   HPV VACCINES  Aged Out     Discussed health benefits of physical activity, and encouraged him to engage in regular exercise appropriate for his age and condition.    ***  No follow-ups on file.     I, LMikey Kirschner PA-C have reviewed all documentation for this visit. The documentation on  10/07/22  for the exam, diagnosis, procedures, and orders are all accurate and complete.  LMikey Kirschner PA-C [facility] [place] [address]Office: [phone]: [phone]

## 2022-10-07 NOTE — Telephone Encounter (Signed)
Pt called x3 to do AWV: left msg to rtn call to office to reschedule.

## 2022-11-19 ENCOUNTER — Other Ambulatory Visit: Payer: Self-pay | Admitting: Physician Assistant

## 2022-11-19 DIAGNOSIS — E039 Hypothyroidism, unspecified: Secondary | ICD-10-CM

## 2023-03-31 ENCOUNTER — Telehealth: Payer: Self-pay | Admitting: Physician Assistant

## 2023-03-31 ENCOUNTER — Other Ambulatory Visit: Payer: Self-pay

## 2023-03-31 DIAGNOSIS — E039 Hypothyroidism, unspecified: Secondary | ICD-10-CM

## 2023-03-31 MED ORDER — LEVOTHYROXINE SODIUM 75 MCG PO TABS: 75.0000 ug | ORAL_TABLET | Freq: Every day | ORAL | 0 refills | Status: DC

## 2023-03-31 NOTE — Telephone Encounter (Signed)
[facility] faxed refill request for the following medications:   levothyroxine (SYNTHROID) 75 MCG table   Please advise.

## 2023-04-01 ENCOUNTER — Ambulatory Visit (INDEPENDENT_AMBULATORY_CARE_PROVIDER_SITE_OTHER): Payer: Medicare Other

## 2023-04-01 VITALS — Ht <= 58 in | Wt 95.0 lb

## 2023-04-01 DIAGNOSIS — Z0000 Encounter for general adult medical examination without abnormal findings: Secondary | ICD-10-CM | POA: Diagnosis not present

## 2023-04-01 NOTE — Patient Instructions (Signed)
[name] , Thank you for taking time to come for your Medicare Wellness Visit. I appreciate your ongoing commitment to your health goals. Please review the following plan we discussed and let me know if I can assist you in the future.   Referrals/Orders/Follow-Ups/Clinician Recommendations: none  This is a list of the screening recommended for you and due dates:  Health Maintenance  Topic Date Due   HIV Screening  Never done   Zoster (Shingles) Vaccine (1 of 2) Never done   Colon Cancer Screening  12/18/2019   DTaP/Tdap/Td vaccine (10 - Td or Tdap) 05/16/2021   COVID-19 Vaccine (3 - 2023-24 season) 04/11/2022   Flu Shot  03/12/2023   Medicare Annual Wellness Visit  03/31/2024   Hepatitis C Screening  Completed   HPV Vaccine  Aged Out    Advanced directives: (Copy Requested) Please bring a copy of your health care power of attorney and living will to the office to be added to your chart at your convenience.  Next Medicare Annual Wellness Visit scheduled for next year: Yes 04/05/24 @ 8:45am telephone

## 2023-04-01 NOTE — Progress Notes (Signed)
Subjective:   Jacob Schmidt is a 52 y.o. male who presents for Medicare Annual/Subsequent preventive examination.  Visit Complete: Virtual  I connected with April Billings/POA/sister/ care-giver regarding pt: Jacob Schmidt on 04/01/23 by a audio enabled telemedicine application and verified that I am speaking with the correct person using two identifiers.  Patient Location: Home  Provider Location: Office/Clinic  I discussed the limitations of evaluation and management by telemedicine. The patient expressed understanding and agreed to proceed.  Vital Signs: Unable to obtain new vitals due to this being a telehealth visit.  Patient Medicare AWV questionnaire was completed by the patient on (not done); I have confirmed that all information answered by patient is correct and no changes since this date.  Review of Systems    Cardiac Risk Factors include: advanced age (>49men, >26 women);dyslipidemia;male gender    Objective:    Today's Vitals   04/01/23 0858  Weight: 95 lb (43.1 kg)  Height: 4\' 8"  (1.422 m)   Body mass index is 21.3 kg/m.     04/01/2023    9:11 AM 10/03/2021    8:18 AM 01/26/2020    9:10 AM 05/08/2016    2:20 PM  Advanced Directives  Does Patient Have a Medical Advance Directive? Yes No No No  Type of Advance Directive Healthcare Power of Attorney     Would patient like information on creating a medical advance directive?  No - Patient declined No - Patient declined No - patient declined information    Current Medications (verified) Outpatient Encounter Medications as of 04/01/2023  Medication Sig   fluticasone (FLONASE) 50 MCG/ACT nasal spray 1 puff per nostril daily   levothyroxine (SYNTHROID) 75 MCG tablet Take 1 tablet (75 mcg total) by mouth daily before breakfast.   Multiple Vitamin (MULTIVITAMIN ADULT PO) Take by mouth daily.    OMEGA-3 FATTY ACIDS PO Take by mouth daily.    EPINEPHrine 0.3 mg/0.3 mL IJ SOAJ injection  (Patient not taking: Reported  on 04/01/2023)   No facility-administered encounter medications on file as of 04/01/2023.    Allergies (verified) Patient has no known allergies.   History: Past Medical History:  Diagnosis Date   Thyroid disease    Past Surgical History:  Procedure Laterality Date   APPENDECTOMY     ASD REPAIR  1985   CHOLECYSTECTOMY     MYRINGOTOMY WITH TUBE PLACEMENT     Family History  Problem Relation Age of Onset   Dementia Mother    Social History   Socioeconomic History   Marital status: Single    Spouse name: Not on file   Number of children: 0   Years of education: Not on file   Highest education level: High school graduate  Occupational History   Not on file  Tobacco Use   Smoking status: Never   Smokeless tobacco: Never  Vaping Use   Vaping status: Never Used  Substance and Sexual Activity   Alcohol use: No   Drug use: Not on file   Sexual activity: Never  Other Topics Concern   Not on file  Social History Narrative   Not on file   Social Determinants of Health   Financial Resource Strain: Low Risk  (04/01/2023)   Overall Financial Resource Strain (CARDIA)    Difficulty of Paying Living Expenses: Not hard at all  Food Insecurity: No Food Insecurity (04/01/2023)   Hunger Vital Sign    Worried About Running Out of Food in the Last Year: Never  true    Ran Out of Food in the Last Year: Never true  Transportation Needs: No Transportation Needs (04/01/2023)   PRAPARE - Administrator, Civil Service (Medical): No    Lack of Transportation (Non-Medical): No  Physical Activity: Sufficiently Active (04/01/2023)   Exercise Vital Sign    Days of Exercise per Week: 6 days    Minutes of Exercise per Session: 40 min  Stress: No Stress Concern Present (04/01/2023)   [employer] of Occupational Health - Occupational Stress Questionnaire    Feeling of Stress : Not at all  Social Connections: Moderately Isolated (04/01/2023)   Social Connection and Isolation  Panel [NHANES]    Frequency of Communication with Friends and Family: More than three times a week    Frequency of Social Gatherings with Friends and Family: Twice a week    Attends Religious Services: More than 4 times per year    Active Member of [employer] or Organizations: No    Attends Engineer, structural: Never    Marital Status: Never married    Tobacco Counseling Counseling given: Not Answered   Clinical Intake:  Pre-visit preparation completed: Yes  Pain : No/denies pain   BMI - recorded: 21.3 Nutritional Status: BMI of 19-24  Normal Nutritional Risks: None Diabetes: No  How often do you need to have someone help you when you read instructions, pamphlets, or other written materials from your doctor or pharmacy?: 5 - Always  Interpreter Needed?: No  Comments: lives with sister April Information entered by :: B.Noele Icenhour,LPN   Activities of Daily Living    04/01/2023    9:11 AM  In your present state of health, do you have any difficulty performing the following activities:  Hearing? 1  Vision? 0  Difficulty concentrating or making decisions? 1  Walking or climbing stairs? 0  Dressing or bathing? 0  Doing errands, shopping? 1  Preparing Food and eating ? Y  Using the Toilet? N  In the past six months, have you accidently leaked urine? N  Do you have problems with loss of bowel control? N  Managing your Medications? Y  Managing your Finances? Y  Housekeeping or managing your Housekeeping? Y    Patient Care Team: Alfredia Ferguson, PA-C as PCP - General (Physician Assistant) Pa, [facility])  Indicate any recent Medical Services you may have received from other than [facility] providers in the past year (date may be approximate).     Assessment:   This is a routine wellness examination for [name].  Hearing/Vision screen Hearing Screening - Comments:: Sister says Inadequate hearing; appt at ENT unsuccessful to assess Vision Screening -  Comments:: Adequate vision with glasses   Dietary issues and exercise activities discussed:     Goals Addressed             This Visit's Progress    DIET - EAT MORE FRUITS AND VEGETABLES   On track      Depression Screen    04/01/2023    9:07 AM 10/03/2021    8:17 AM 08/27/2021   11:06 AM 04/10/2020    9:04 AM 01/26/2020    9:06 AM 05/19/2017    8:43 AM  PHQ 2/9 Scores  PHQ - 2 Score 0 0 0 0 0 0  PHQ- 9 Score  0 0 0      Fall Risk    04/01/2023    9:04 AM 10/03/2021    8:19 AM 08/27/2021  11:06 AM 04/10/2020    9:03 AM 01/26/2020    9:12 AM  Fall Risk   Falls in the past year? 0 0 0 0 0  Number falls in past yr: 0 0 0 0 0  Injury with Fall? 0 0 0 0 0  Risk for fall due to : No Fall Risks No Fall Risks No Fall Risks    Follow up Education provided;Falls prevention discussed Falls evaluation completed       MEDICARE RISK AT HOME: Medicare Risk at Home Any stairs in or around the home?: Yes If so, are there any without handrails?: Yes Home free of loose throw rugs in walkways, pet beds, electrical cords, etc?: Yes Adequate lighting in your home to reduce risk of falls?: Yes Life alert?: No Use of a cane, walker or w/c?: No Grab bars in the bathroom?: No Shower chair or bench in shower?: No Elevated toilet seat or a handicapped toilet?: No  TIMED UP AND GO:  Was the test performed?  No    Cognitive Function: pt unable to do       Immunizations Immunization History  Administered Date(s) Administered   DTP 09/07/1969, 11/27/1969, 11/15/1970, 05/14/1974   Influenza Split 06/06/2006, 05/17/2011   Influenza,inj,Quad PF,6+ Mos 05/04/2013, 05/08/2016, 05/19/2017   MMR 08/31/1970   OPV 09/07/1969, 11/27/1969, 11/15/1970, 05/14/1974   PFIZER(Purple Top)SARS-COV-2 Vaccination 04/04/2020, 04/25/2020   Pneumococcal Polysaccharide-23 06/11/1994   Td 05/14/1974, 05/29/1985, 04/09/1995   Tdap 05/30/2005, 06/27/2007, 05/17/2011    TDAP status: Up to date  Flu  Vaccine status: Up to date  Pneumococcal vaccine status: Up to date  Covid-19 vaccine status: Completed vaccines  Qualifies for Shingles Vaccine? No    Screening Tests Health Maintenance  Topic Date Due   HIV Screening  Never done   Zoster Vaccines- Shingrix (1 of 2) Never done   Colonoscopy  12/18/2019   DTaP/Tdap/Td (10 - Td or Tdap) 05/16/2021   COVID-19 Vaccine (3 - 2023-24 season) 04/11/2022   INFLUENZA VACCINE  03/12/2023   Medicare Annual Wellness (AWV)  03/31/2024   Hepatitis C Screening  Completed   HPV VACCINES  Aged Out    Health Maintenance  Health Maintenance Due  Topic Date Due   HIV Screening  Never done   Zoster Vaccines- Shingrix (1 of 2) Never done   Colonoscopy  12/18/2019   DTaP/Tdap/Td (10 - Td or Tdap) 05/16/2021   COVID-19 Vaccine (3 - 2023-24 season) 04/11/2022   INFLUENZA VACCINE  03/12/2023     Lung Cancer Screening: (Low Dose CT Chest recommended if Age 45-80 years, 20 pack-year currently smoking OR have quit w/in 15years.) does not qualify.   Lung Cancer Screening Referral: no  Additional Screening:  Hepatitis C Screening: does not qualify; Completed yes  Vision Screening: Recommended annual ophthalmology exams for early detection of glaucoma and other disorders of the eye. Is the patient up to date with their annual eye exam?  Yes  Who is the provider or what is the name of the office in which the patient attends annual eye exams? [facility] If pt is not established with a provider, would they like to be referred to a provider to establish care? No .   Dental Screening: Recommended annual dental exams for proper oral hygiene  Diabetic Foot Exam: n/a  Community Resource Referral / Chronic Care Management: CRR required this visit?  No   CCM required this visit?  No    Plan:     I have personally  reviewed and noted the following in the patient's chart:   Medical and social history Use of alcohol, tobacco or illicit drugs   Current medications and supplements including opioid prescriptions. Patient is not currently taking opioid prescriptions. Functional ability and status Nutritional status Physical activity Advanced directives List of other physicians Hospitalizations, surgeries, and ER visits in previous 12 months Vitals Screenings to include cognitive, depression, and falls Referrals and appointments  In addition, I have reviewed and discussed with patient certain preventive protocols, quality metrics, and best practice recommendations. A written personalized care plan for preventive services as well as general preventive health recommendations were provided to patient.     Sue Lush, LPN   01/29/3085   After Visit Summary: (Declined) Due to this being a telephonic visit, with patients personalized plan was offered to patient but patient Declined AVS at this time   Nurse Notes: I spoke with caregiver/sister/POA re pt. She relays he is doing well and she has no questions or concerns.

## 2023-04-08 ENCOUNTER — Encounter: Payer: Self-pay | Admitting: Family Medicine

## 2023-04-08 ENCOUNTER — Ambulatory Visit (INDEPENDENT_AMBULATORY_CARE_PROVIDER_SITE_OTHER): Payer: Medicare Other | Admitting: Family Medicine

## 2023-04-08 VITALS — BP 104/69 | HR 71 | Ht <= 58 in | Wt 95.7 lb

## 2023-04-08 DIAGNOSIS — F6389 Other impulse disorders: Secondary | ICD-10-CM | POA: Diagnosis not present

## 2023-04-08 DIAGNOSIS — R799 Abnormal finding of blood chemistry, unspecified: Secondary | ICD-10-CM

## 2023-04-08 DIAGNOSIS — E559 Vitamin D deficiency, unspecified: Secondary | ICD-10-CM | POA: Diagnosis not present

## 2023-04-08 DIAGNOSIS — Q909 Down syndrome, unspecified: Secondary | ICD-10-CM

## 2023-04-08 DIAGNOSIS — Z114 Encounter for screening for human immunodeficiency virus [HIV]: Secondary | ICD-10-CM | POA: Diagnosis not present

## 2023-04-08 DIAGNOSIS — Z125 Encounter for screening for malignant neoplasm of prostate: Secondary | ICD-10-CM | POA: Insufficient documentation

## 2023-04-08 DIAGNOSIS — Z8774 Personal history of (corrected) congenital malformations of heart and circulatory system: Secondary | ICD-10-CM | POA: Diagnosis not present

## 2023-04-08 DIAGNOSIS — E039 Hypothyroidism, unspecified: Secondary | ICD-10-CM

## 2023-04-08 DIAGNOSIS — Z87798 Personal history of other (corrected) congenital malformations: Secondary | ICD-10-CM | POA: Diagnosis not present

## 2023-04-08 DIAGNOSIS — Q760 Spina bifida occulta: Secondary | ICD-10-CM

## 2023-04-08 DIAGNOSIS — E7800 Pure hypercholesterolemia, unspecified: Secondary | ICD-10-CM | POA: Diagnosis not present

## 2023-04-08 DIAGNOSIS — Z1211 Encounter for screening for malignant neoplasm of colon: Secondary | ICD-10-CM | POA: Diagnosis not present

## 2023-04-08 DIAGNOSIS — E441 Mild protein-calorie malnutrition: Secondary | ICD-10-CM | POA: Diagnosis not present

## 2023-04-08 NOTE — Assessment & Plan Note (Signed)
Chronic, not on medication previously Repeat LP recommend diet low in saturated fat and regular exercise - 30 min at least 5 times per week The 10-year ASCVD risk score (Arnett DK, et al., 2019) is: 3.1%

## 2023-04-08 NOTE — Assessment & Plan Note (Signed)
Acute on chronic, worsening Continue to monitor; may benefit from start of zoloft to further assist

## 2023-04-08 NOTE — Assessment & Plan Note (Signed)
Chronic, previously variable Repeat labs Increased activity noted including picking of clothes and skin Previously maintained on 75 mcg Synthroid

## 2023-04-08 NOTE — Assessment & Plan Note (Signed)
Chronic, stable No murmurs, rubs, gallops BP remains stable Continue to monitor

## 2023-04-08 NOTE — Assessment & Plan Note (Signed)
Chronic, no further concerns Continue to monitor independence and concerns for possible physical limitations

## 2023-04-08 NOTE — Assessment & Plan Note (Signed)
Chronic, no concerns Continue to live independently with sister, April and her spouse Continue to monitor

## 2023-04-08 NOTE — Progress Notes (Signed)
Established patient visit   Patient: Jacob Schmidt   DOB: [birth year]   52 y.o. Male  MRN: [id number] Visit Date: 04/08/2023  Today's healthcare provider: Jacky Kindle, FNP  Introduced to nurse practitioner role and practice setting.  All questions answered.  Discussed provider/patient relationship and expectations.  Subjective    HPI HPI     Medical Management of Chronic Issues    Additional comments: Follow up on medications , a lot more fidgety than before and would like to discuss      Last edited by Rolly Salter, CMA on 04/08/2023  9:43 AM.      Medications: Outpatient Medications Prior to Visit  Medication Sig   EPINEPHrine 0.3 mg/0.3 mL IJ SOAJ injection    fluticasone (FLONASE) 50 MCG/ACT nasal spray 1 puff per nostril daily   levothyroxine (SYNTHROID) 75 MCG tablet Take 1 tablet (75 mcg total) by mouth daily before breakfast.   Multiple Vitamin (MULTIVITAMIN ADULT PO) Take by mouth daily.    OMEGA-3 FATTY ACIDS PO Take by mouth daily.    No facility-administered medications prior to visit.     Objective    BP 104/69 (BP Location: Left Arm, Patient Position: Sitting, Cuff Size: Normal)   Pulse 71   Ht 4\' 8"  (1.422 m)   Wt 95 lb 11.2 oz (43.4 kg)   SpO2 100%   BMI 21.46 kg/m   Physical Exam Vitals and nursing note reviewed.  Constitutional:      Appearance: Normal appearance. He is normal weight.  HENT:     Head: Normocephalic and atraumatic.  Eyes:     Comments: Dysconjugate gaze; chronic  Use of eyeglasses  Cardiovascular:     Rate and Rhythm: Normal rate and regular rhythm.     Pulses: Normal pulses.     Heart sounds: Normal heart sounds.  Pulmonary:     Effort: Pulmonary effort is normal.     Breath sounds: Normal breath sounds.  Abdominal:     General: Bowel sounds are normal.     Palpations: Abdomen is soft.  Musculoskeletal:        General: Normal range of motion.     Cervical back: Normal range of motion.  Skin:    General: Skin  is warm and dry.     Capillary Refill: Capillary refill takes less than 2 seconds.  Neurological:     General: No focal deficit present.     Mental Status: He is alert and oriented to person, place, and time. Mental status is at baseline.  Psychiatric:        Mood and Affect: Mood normal.        Behavior: Behavior normal.        Thought Content: Thought content normal.        Judgment: Judgment normal.     Comments: Sister, patient's legal guardian, reports increased picking behaviors      No results found for any visits on 04/08/23.  Assessment & Plan     Problem List Items Addressed This Visit       Endocrine   Adult hypothyroidism - Primary    Chronic, previously variable Repeat labs Increased activity noted including picking of clothes and skin Previously maintained on 75 mcg Synthroid       Relevant Orders   TSH     Nervous and Auditory   Occult spina bifida    Chronic, no concerns Continue to live independently with sister, April and her spouse  Continue to monitor         Other   Abnormal finding of blood chemistry, unspecified   Relevant Orders   Hemoglobin A1c   Down's syndrome    Chronic, no concerns Continues to live with sister       Encounter for screening for human immunodeficiency virus (HIV)   Relevant Orders   HIV antibody (with reflex)   H/O congenital malformation    Chronic, no further concerns Continue to monitor independence and concerns for possible physical limitations       Relevant Orders   CBC with Differential/Platelet   Comprehensive metabolic panel   Lipid panel   Magnesium   TSH   Hemoglobin A1c   VITAMIN D 25 Hydroxy (Vit-D Deficiency, Fractures)   HIV antibody (with reflex)   Mild protein-calorie malnutrition (HCC)   Relevant Orders   Lipid panel   Picking at clothes    Acute on chronic, worsening Continue to monitor; may benefit from start of zoloft to further assist       Screen for colon cancer   Relevant  Orders   Ambulatory referral to Gastroenterology   Cologuard   Status post primum atrial septal defect repair    Chronic, stable No murmurs, rubs, gallops BP remains stable Continue to monitor       Vitamin D deficiency, unspecified   Relevant Orders   VITAMIN D 25 Hydroxy (Vit-D Deficiency, Fractures)   Return in about 6 months (around 10/09/2023), or if symptoms worsen or fail to improve, for chonic disease management.     Leilani Merl, FNP, have reviewed all documentation for this visit. The documentation on 04/08/23 for the exam, diagnosis, procedures, and orders are all accurate and complete.  Jacky Kindle, FNP  [facility] [phone] (phone) [phone] (fax)  [facility]

## 2023-04-08 NOTE — Assessment & Plan Note (Signed)
Chronic, no concerns Continues to live with sister

## 2023-04-08 NOTE — Patient Instructions (Signed)
The CDC recommends two doses of Shingrix (the new shingles vaccine) separated by 2 to 6 months for adults age 54 years and older. I recommend checking with your insurance plan regarding coverage for this vaccine.    Recommend TDAP if you receive a cut or puncture or an animal bite.

## 2023-04-09 LAB — CBC WITH DIFFERENTIAL/PLATELET
Basophils Absolute: 0.1 10*3/uL (ref 0.0–0.2)
Basos: 2 %
EOS (ABSOLUTE): 0.1 10*3/uL (ref 0.0–0.4)
Eos: 1 %
Hematocrit: 48.3 % (ref 37.5–51.0)
Hemoglobin: 16 g/dL (ref 13.0–17.7)
Immature Grans (Abs): 0 10*3/uL (ref 0.0–0.1)
Immature Granulocytes: 0 %
Lymphocytes Absolute: 1.3 10*3/uL (ref 0.7–3.1)
Lymphs: 24 %
MCH: 31.3 pg (ref 26.6–33.0)
MCHC: 33.1 g/dL (ref 31.5–35.7)
MCV: 95 fL (ref 79–97)
Monocytes Absolute: 0.5 10*3/uL (ref 0.1–0.9)
Monocytes: 10 %
Neutrophils Absolute: 3.5 10*3/uL (ref 1.4–7.0)
Neutrophils: 63 %
Platelets: 210 10*3/uL (ref 150–450)
RBC: 5.11 x10E6/uL (ref 4.14–5.80)
RDW: 11.9 % (ref 11.6–15.4)
WBC: 5.5 10*3/uL (ref 3.4–10.8)

## 2023-04-09 LAB — COMPREHENSIVE METABOLIC PANEL
ALT: 27 IU/L (ref 0–44)
AST: 30 IU/L (ref 0–40)
Albumin: 4 g/dL (ref 3.8–4.9)
Alkaline Phosphatase: 83 IU/L (ref 44–121)
BUN/Creatinine Ratio: 16 (ref 9–20)
BUN: 20 mg/dL (ref 6–24)
Bilirubin Total: 0.4 mg/dL (ref 0.0–1.2)
CO2: 25 mmol/L (ref 20–29)
Calcium: 9 mg/dL (ref 8.7–10.2)
Chloride: 100 mmol/L (ref 96–106)
Creatinine, Ser: 1.24 mg/dL (ref 0.76–1.27)
Globulin, Total: 2.7 g/dL (ref 1.5–4.5)
Glucose: 81 mg/dL (ref 70–99)
Potassium: 4.2 mmol/L (ref 3.5–5.2)
Sodium: 138 mmol/L (ref 134–144)
Total Protein: 6.7 g/dL (ref 6.0–8.5)
eGFR: 70 mL/min/{1.73_m2} (ref >59)

## 2023-04-09 LAB — LIPID PANEL
Chol/HDL Ratio: 4.3 ratio (ref 0.0–5.0)
Cholesterol, Total: 172 mg/dL (ref 100–199)
HDL: 40 mg/dL (ref >39)
LDL Chol Calc (NIH): 94 mg/dL (ref 0–99)
Triglycerides: 223 mg/dL — ABNORMAL HIGH (ref 0–149)
VLDL Cholesterol Cal: 38 mg/dL (ref 5–40)

## 2023-04-09 LAB — HEMOGLOBIN A1C
Est. average glucose Bld gHb Est-mCnc: 108 mg/dL
Hgb A1c MFr Bld: 5.4 % (ref 4.8–5.6)

## 2023-04-09 LAB — VITAMIN D 25 HYDROXY (VIT D DEFICIENCY, FRACTURES): Vit D, 25-Hydroxy: 29 ng/mL — ABNORMAL LOW (ref 30.0–100.0)

## 2023-04-09 LAB — HIV ANTIBODY (ROUTINE TESTING W REFLEX): HIV Screen 4th Generation wRfx: NONREACTIVE

## 2023-04-09 LAB — MAGNESIUM: Magnesium: 2.3 mg/dL (ref 1.6–2.3)

## 2023-04-09 LAB — TSH: TSH: 0.383 u[IU]/mL — ABNORMAL LOW (ref 0.450–4.500)

## 2023-04-09 NOTE — Progress Notes (Signed)
Can pt/pt's sister (guardian) -Stable cholesterol; The 10-year ASCVD risk score (Arnett DK, et al., 2019) is: 3.3% -TSH is slightly low; defer changes without signs of irritability, diarrhea, tachycardia (fast heart rate) or weight loss; recommend repeat labs in 3-6 months -low Vit D; recommend 2000 IU Vit D 3 daily to assist, available OTC  All other labs are normal and stable.

## 2023-04-10 NOTE — Progress Notes (Signed)
Patient advised. Verbalized understanding 

## 2023-07-03 ENCOUNTER — Other Ambulatory Visit: Payer: Self-pay | Admitting: Family Medicine

## 2023-07-03 DIAGNOSIS — E039 Hypothyroidism, unspecified: Secondary | ICD-10-CM

## 2023-08-25 ENCOUNTER — Telehealth: Payer: Self-pay

## 2023-08-25 NOTE — Telephone Encounter (Signed)
 Copied from CRM [id number]. Topic: General - Other >> Aug 25, 2023  8:47 AM Tobias CROME wrote: Reason for CRM: April calling to inform provider that social security is reviewing pt's disability status, and should be reaching out to provider to confirm pt has down syndrome and is disabled.

## 2023-09-23 ENCOUNTER — Other Ambulatory Visit: Payer: Self-pay

## 2023-09-23 ENCOUNTER — Telehealth: Payer: Self-pay

## 2023-09-23 ENCOUNTER — Other Ambulatory Visit: Payer: Self-pay | Admitting: Family Medicine

## 2023-09-23 DIAGNOSIS — E039 Hypothyroidism, unspecified: Secondary | ICD-10-CM

## 2023-09-23 MED ORDER — LEVOTHYROXINE SODIUM 75 MCG PO TABS
75.0000 ug | ORAL_TABLET | Freq: Every day | ORAL | 0 refills | Status: DC
Start: 2023-09-23 — End: 2023-10-27

## 2023-09-23 NOTE — Telephone Encounter (Signed)
Can you please make them an appointment.  He will need to be seen before getting the labs because he has not seen anyone since [place] left. Thanks    Copied from CRM [id number]. Topic: General - Other >> Sep 23, 2023  8:31 AM Franchot Heidelberg wrote: Reason for CRM: Pt's sister called for a recheck of the patient's thyroid and vitamin D levels, please advise when labs are ordered   Best contact: [phone]

## 2023-09-23 NOTE — Telephone Encounter (Signed)
Patient needsd #30 Synthroid to last until he comes in for his appt 10/16/23.

## 2023-09-24 NOTE — Telephone Encounter (Signed)
Requested Prescriptions  Pending Prescriptions Disp Refills   levothyroxine (SYNTHROID) 75 MCG tablet [Pharmacy Med Name: LEVOTHYROXINE 0.075MG  ( ) TABS] 90 tablet     Sig: TAKE 1 TABLET(75 MCG) BY MOUTH DAILY BEFORE BREAKFAST     Endocrinology:  Hypothyroid Agents Failed - 09/24/2023 12:51 PM      Failed - TSH in normal range and within 360 days    TSH  Date Value Ref Range Status  04/08/2023 0.383 (L) 0.450 - 4.500 uIU/mL Final         Passed - Valid encounter within last 12 months    Recent Outpatient Visits           5 months ago Adult hypothyroidism   [place] [facility] Jacky Kindle, FNP   1 year ago Adult hypothyroidism   [place] [facility] Alfredia Ferguson, PA-C   2 years ago Adult hypothyroidism   [facility] Malva Limes, MD   3 years ago Redness and swelling of elbow   [facility] Trey Sailors, [place]   4 years ago Adult hypothyroidism   [facility] Trey Sailors, [place]       Future Appointments             In 3 weeks Pardue, Monico Blitz, DO [place] [facility], [facility]

## 2023-10-16 ENCOUNTER — Ambulatory Visit (INDEPENDENT_AMBULATORY_CARE_PROVIDER_SITE_OTHER): Payer: Self-pay | Admitting: Family Medicine

## 2023-10-16 ENCOUNTER — Encounter: Payer: Self-pay | Admitting: Family Medicine

## 2023-10-16 VITALS — BP 105/67 | HR 72 | Ht <= 58 in | Wt 95.0 lb

## 2023-10-16 DIAGNOSIS — E559 Vitamin D deficiency, unspecified: Secondary | ICD-10-CM

## 2023-10-16 DIAGNOSIS — Q909 Down syndrome, unspecified: Secondary | ICD-10-CM

## 2023-10-16 DIAGNOSIS — E039 Hypothyroidism, unspecified: Secondary | ICD-10-CM | POA: Diagnosis not present

## 2023-10-16 DIAGNOSIS — E7800 Pure hypercholesterolemia, unspecified: Secondary | ICD-10-CM | POA: Diagnosis not present

## 2023-10-16 DIAGNOSIS — J301 Allergic rhinitis due to pollen: Secondary | ICD-10-CM | POA: Diagnosis not present

## 2023-10-16 DIAGNOSIS — M109 Gout, unspecified: Secondary | ICD-10-CM | POA: Diagnosis not present

## 2023-10-16 DIAGNOSIS — N182 Chronic kidney disease, stage 2 (mild): Secondary | ICD-10-CM

## 2023-10-16 DIAGNOSIS — H1132 Conjunctival hemorrhage, left eye: Secondary | ICD-10-CM | POA: Diagnosis not present

## 2023-10-16 MED ORDER — COLCHICINE 0.6 MG PO TABS: ORAL_TABLET | ORAL | 1 refills | Status: AC

## 2023-10-16 MED ORDER — INDOMETHACIN 50 MG PO CAPS: 50.0000 mg | ORAL_CAPSULE | Freq: Three times a day (TID) | ORAL | 1 refills | Status: DC

## 2023-10-16 NOTE — Progress Notes (Signed)
 Established patient visit   Patient: Jacob Schmidt   DOB: 08-Oct-1970   53 y.o. Male  MRN: [id number] Visit Date: 10/16/2023  Today's healthcare provider: Sherlyn Hay, DO   Chief Complaint  Patient presents with   Hyperthyroidism   Subjective    HPI The patient presents for follow-up on hypothyroidism and cholesterol management. He is accompanied by his sister, who is his primary caregiver.  He has hypothyroidism with no changes in symptoms such as irritability, diarrhea, or tachycardia. He manages his medications with a pill organizer set up by his sister, allowing him to take them independently.  There is a noted decline in his mental acuity over the past six months, with increasing forgetfulness regarding personal hygiene routines such as changing clothes, showering, and brushing teeth. Despite these challenges, he continues to manage his medication regimen with assistance.  He experiences episodes of gout, with the last occurrence about a month and a half ago. Symptoms typically improve with two or three doses of medication, elevation, and ice. The gout primarily affects his knee and foot.  He has experienced episodes of subconjunctival hemorrhage in his left eye, likely due to forceful nose blowing. These episodes resolve on his own within a few days without affecting his vision.  He has a history of allergies and previously received allergy shots. He uses Flonase and occasionally takes Zyrtec for allergy management. He has not been taking daily allergy medication recently but may start again if symptoms worsen.     Medications: Outpatient Medications Prior to Visit  Medication Sig   cholecalciferol (VITAMIN D3) 25 MCG (1000 UNIT) tablet Take 1,000 Units by mouth daily. Nature made   EPINEPHrine 0.3 mg/0.3 mL IJ SOAJ injection    fluticasone (FLONASE) 50 MCG/ACT nasal spray 1 puff per nostril daily   levothyroxine (SYNTHROID) 75 MCG tablet Take 1 tablet (75 mcg  total) by mouth daily before breakfast.   OMEGA-3 FATTY ACIDS PO Take by mouth daily.    [DISCONTINUED] Multiple Vitamin (MULTIVITAMIN ADULT PO) Take by mouth daily.    No facility-administered medications prior to visit.        Objective    BP 105/67   Pulse 72   Ht 4\' 8"  (1.422 m)   Wt 95 lb (43.1 kg)   BMI 21.30 kg/m     Physical Exam Vitals reviewed.  Constitutional:      General: He is not in acute distress.    Appearance: Normal appearance. He is not diaphoretic.  HENT:     Head: Normocephalic and atraumatic.  Eyes:     General: No scleral icterus.    Conjunctiva/sclera: Conjunctivae normal.     Comments: Disconjugate gaze, chronic  Cardiovascular:     Rate and Rhythm: Normal rate and regular rhythm.     Pulses: Normal pulses.     Heart sounds: Normal heart sounds. No murmur heard. Pulmonary:     Effort: Pulmonary effort is normal. No respiratory distress.     Breath sounds: Normal breath sounds. No wheezing or rhonchi.  Musculoskeletal:     Cervical back: Neck supple.  Lymphadenopathy:     Cervical: No cervical adenopathy.  Skin:    General: Skin is warm and dry.     Findings: No rash.  Neurological:     Mental Status: He is alert and oriented to person, place, and time. Mental status is at baseline.  Psychiatric:        Mood and Affect: Mood normal.  Behavior: Behavior normal.      No results found for any visits on 10/16/23.  Assessment & Plan    Adult hypothyroidism Assessment & Plan: Follow-up for hypothyroidism. No changes in symptoms such as irritability, diarrhea, or tachycardia. Previous labs showed fluctuating thyroid levels. Current dosage is 75 mcg; adjustments may be needed based on today's lab results. Discussed combining different dosages or alternating doses if necessary. - Check thyroid levels - Adjust dosage based on lab results   Orders: -     TSH Rfx on Abnormal to Free T4  Vitamin D deficiency, unspecified -      VITAMIN D 25 Hydroxy (Vit-D Deficiency, Fractures)  Elevated LDL cholesterol level Assessment & Plan: Follow-up for hyperlipidemia. No changes in management since the last visit. Will check cholesterol levels today. - Check cholesterol levels  Orders: -     Lipid panel  Chronic kidney disease, stage 2, mildly decreased GFR -     Comprehensive metabolic panel  Gout, unspecified cause, unspecified chronicity, unspecified site Assessment & Plan: Recent gout flare-up about six weeks ago. Symptoms managed with medication, elevation, and ice. Currently out of medication. - Prescribe indomethacin 30 tablets  Orders: -     Indomethacin; Take 1 capsule (50 mg total) by mouth 3 (three) times daily with meals.  Dispense: 30 capsule; Refill: 1  Down's syndrome Assessment & Plan: Reported decline in mental acuity over the past six months, including issues with personal hygiene and medication management. No acute changes in sleep or behavior. Continues to manage medications with assistance. - Monitor cognitive function - Consider further evaluation if decline continues   Subconjunctival hematoma, left Assessment & Plan: Recurrent subconjunctival hemorrhage in the left eye due to forceful nose blowing. No vision changes reported. Condition resolves spontaneously. - Advise to avoid forceful nose blowing - Monitor for vision changes - Continue follow-up with ophthalmology   Seasonal allergic rhinitis due to pollen Assessment & Plan: Allergic rhinitis with previous allergy shots. Currently uses Flonase as needed. Discussed starting daily Zyrtec and monitoring for seasonal allergy patterns. - Start daily Zyrtec - Monitor for seasonal allergy patterns   General Health Maintenance Due for shingles and Tdap vaccines. Previous colonoscopy screening done last year; results not received. Discussed risks of shingles, including painful nerve pain, potential vision and hearing issues, and difficulty  swallowing. Recommended following up with the previous provider for colonoscopy results. - Recommend shingles vaccine at pharmacy - Recommend Tdap vaccine at pharmacy - Follow up with previous provider for colonoscopy results   Return in about 6 months (around 04/17/2024) for thyroid, vit d, hld.      I discussed the assessment and treatment plan with the patient  The patient was provided an opportunity to ask questions and all were answered. The patient agreed with the plan and demonstrated an understanding of the instructions.   The patient was advised to call back or seek an in-person evaluation if the symptoms worsen or if the condition fails to improve as anticipated.    Sherlyn Hay, DO  [facility] [phone] (phone) [phone] (fax)  [facility]

## 2023-10-16 NOTE — Assessment & Plan Note (Signed)
 Allergic rhinitis with previous allergy shots. Currently uses Flonase as needed. Discussed starting daily Zyrtec and monitoring for seasonal allergy patterns. - Start daily Zyrtec - Monitor for seasonal allergy patterns

## 2023-10-16 NOTE — Assessment & Plan Note (Signed)
 Recurrent subconjunctival hemorrhage in the left eye due to forceful nose blowing. No vision changes reported. Condition resolves spontaneously. - Advise to avoid forceful nose blowing - Monitor for vision changes - Continue follow-up with ophthalmology

## 2023-10-16 NOTE — Assessment & Plan Note (Signed)
 Follow-up for hyperlipidemia. No changes in management since the last visit. Will check cholesterol levels today. - Check cholesterol levels

## 2023-10-16 NOTE — Addendum Note (Signed)
 Addended by: Jacquenette Shone on: 10/16/2023 11:54 AM   Modules accepted: Orders

## 2023-10-16 NOTE — Assessment & Plan Note (Signed)
 Recent gout flare-up about six weeks ago. Symptoms managed with medication, elevation, and ice. Currently out of medication. - Prescribe indomethacin 30 tablets

## 2023-10-16 NOTE — Assessment & Plan Note (Signed)
 Reported decline in mental acuity over the past six months, including issues with personal hygiene and medication management. No acute changes in sleep or behavior. Continues to manage medications with assistance. - Monitor cognitive function - Consider further evaluation if decline continues

## 2023-10-16 NOTE — Assessment & Plan Note (Signed)
 Follow-up for hypothyroidism. No changes in symptoms such as irritability, diarrhea, or tachycardia. Previous labs showed fluctuating thyroid levels. Current dosage is 75 mcg; adjustments may be needed based on today's lab results. Discussed combining different dosages or alternating doses if necessary. - Check thyroid levels - Adjust dosage based on lab results

## 2023-10-16 NOTE — Patient Instructions (Addendum)
 Recommended vaccines: Tdap and Shingrix  Contact Cologuard company about result as we have not received it.

## 2023-10-17 LAB — COMPREHENSIVE METABOLIC PANEL
ALT: 30 IU/L (ref 0–44)
AST: 30 IU/L (ref 0–40)
Albumin: 4.1 g/dL (ref 3.8–4.9)
Alkaline Phosphatase: 87 IU/L (ref 44–121)
BUN/Creatinine Ratio: 17 (ref 9–20)
BUN: 20 mg/dL (ref 6–24)
Bilirubin Total: 0.3 mg/dL (ref 0.0–1.2)
CO2: 26 mmol/L (ref 20–29)
Calcium: 9.2 mg/dL (ref 8.7–10.2)
Chloride: 103 mmol/L (ref 96–106)
Creatinine, Ser: 1.16 mg/dL (ref 0.76–1.27)
Globulin, Total: 2.8 g/dL (ref 1.5–4.5)
Glucose: 86 mg/dL (ref 70–99)
Potassium: 4 mmol/L (ref 3.5–5.2)
Sodium: 141 mmol/L (ref 134–144)
Total Protein: 6.9 g/dL (ref 6.0–8.5)
eGFR: 75 mL/min/{1.73_m2} (ref >59)

## 2023-10-17 LAB — LIPID PANEL
Chol/HDL Ratio: 4.5 ratio (ref 0.0–5.0)
Cholesterol, Total: 194 mg/dL (ref 100–199)
HDL: 43 mg/dL (ref >39)
LDL Chol Calc (NIH): 127 mg/dL — ABNORMAL HIGH (ref 0–99)
Triglycerides: 133 mg/dL (ref 0–149)
VLDL Cholesterol Cal: 24 mg/dL (ref 5–40)

## 2023-10-17 LAB — VITAMIN D 25 HYDROXY (VIT D DEFICIENCY, FRACTURES): Vit D, 25-Hydroxy: 44.3 ng/mL (ref 30.0–100.0)

## 2023-10-17 LAB — TSH RFX ON ABNORMAL TO FREE T4: TSH: 0.953 u[IU]/mL (ref 0.450–4.500)

## 2023-10-26 ENCOUNTER — Encounter: Payer: Self-pay | Admitting: Family Medicine

## 2023-10-27 ENCOUNTER — Other Ambulatory Visit: Payer: Self-pay | Admitting: Family Medicine

## 2023-10-27 DIAGNOSIS — E039 Hypothyroidism, unspecified: Secondary | ICD-10-CM

## 2024-04-05 ENCOUNTER — Ambulatory Visit (INDEPENDENT_AMBULATORY_CARE_PROVIDER_SITE_OTHER): Payer: Medicare Other

## 2024-04-05 DIAGNOSIS — Z0000 Encounter for general adult medical examination without abnormal findings: Secondary | ICD-10-CM

## 2024-04-05 NOTE — Progress Notes (Signed)
 Subjective:   Jacob Schmidt is a 53 y.o. who presents for a Medicare Wellness preventive visit.  As a reminder, Annual Wellness Visits don't include a physical exam, and some assessments may be limited, especially if this visit is performed virtually. We may recommend an in-person follow-up visit with your provider if needed.  Visit Complete: Virtual I connected with  Jacob Schmidt on 04/05/24 by a audio enabled telemedicine application and verified that I am speaking with the correct person using two identifiers.  Patient Location: Home  Provider Location: Office/Clinic  I discussed the limitations of evaluation and management by telemedicine. The patient expressed understanding and agreed to proceed.  Vital Signs: Because this visit was a virtual/telehealth visit, some criteria may be missing or patient reported. Any vitals not documented were not able to be obtained and vitals that have been documented are patient reported.  VideoDeclined- This patient declined Librarian, academic. Therefore the visit was completed with audio only.  Persons Participating in Visit: Patient assisted by sister April.  AWV Questionnaire: No: Patient Medicare AWV questionnaire was not completed prior to this visit.  Cardiac Risk Factors include: male gender     Objective:    There were no vitals filed for this visit. There is no height or weight on file to calculate BMI.     04/05/2024    8:21 AM 04/01/2023    9:11 AM 10/03/2021    8:18 AM 01/26/2020    9:10 AM 05/08/2016    2:20 PM  Advanced Directives  Does Patient Have a Medical Advance Directive? No Yes No No No   Type of Advance Directive  Healthcare Power of Attorney     Would patient like information on creating a medical advance directive? No - Patient declined  No - Patient declined No - Patient declined No - patient declined information      Data saved with a previous flowsheet row definition    Current  Medications (verified) Outpatient Encounter Medications as of 04/05/2024  Medication Sig   cetirizine (ZYRTEC) 10 MG tablet Take 10 mg by mouth daily.   cholecalciferol (VITAMIN D3) 25 MCG (1000 UNIT) tablet Take 1,000 Units by mouth daily. Nature made   colchicine  0.6 MG tablet 1.2 mg (two 0.6-mg tablets) orally at the first sign of a flare followed by 0.6 mg (1 tablet) one hour later. May take one tablet every 12 hours thereafter until flare resolves.   EPINEPHrine 0.3 mg/0.3 mL IJ SOAJ injection    lactobacillus acidophilus (BACID) TABS tablet Take 2 tablets by mouth 3 (three) times daily.   levothyroxine  (SYNTHROID ) 75 MCG tablet TAKE 1 TABLET(75 MCG) BY MOUTH DAILY BEFORE BREAKFAST   OMEGA-3 FATTY ACIDS PO Take by mouth daily.    fluticasone (FLONASE) 50 MCG/ACT nasal spray 1 puff per nostril daily (Patient not taking: Reported on 04/05/2024)   No facility-administered encounter medications on file as of 04/05/2024.    Allergies (verified) Patient has no known allergies.   History: Past Medical History:  Diagnosis Date   Thyroid  disease    Past Surgical History:  Procedure Laterality Date   APPENDECTOMY     ASD REPAIR  1985   CHOLECYSTECTOMY     MYRINGOTOMY WITH TUBE PLACEMENT     Family History  Problem Relation Age of Onset   Dementia Mother    Social History   Socioeconomic History   Marital status: Single    Spouse name: Not on file   Number  of children: 0   Years of education: Not on file   Highest education level: High school graduate  Occupational History   Not on file  Tobacco Use   Smoking status: Never   Smokeless tobacco: Never  Vaping Use   Vaping status: Never Used  Substance and Sexual Activity   Alcohol use: No   Drug use: Not on file   Sexual activity: Never  Other Topics Concern   Not on file  Social History Narrative   Not on file   Social Drivers of Health   Financial Resource Strain: Low Risk  (04/05/2024)   Overall Financial Resource  Strain (CARDIA)    Difficulty of Paying Living Expenses: Not hard at all  Food Insecurity: No Food Insecurity (04/05/2024)   Hunger Vital Sign    Worried About Running Out of Food in the Last Year: Never true    Ran Out of Food in the Last Year: Never true  Transportation Needs: No Transportation Needs (04/05/2024)   PRAPARE - Administrator, Civil Service (Medical): No    Lack of Transportation (Non-Medical): No  Physical Activity: Sufficiently Active (04/05/2024)   Exercise Vital Sign    Days of Exercise per Week: 7 days    Minutes of Exercise per Session: 30 min  Stress: No Stress Concern Present (04/05/2024)   [employer] of Occupational Health - Occupational Stress Questionnaire    Feeling of Stress: Not at all  Social Connections: Moderately Isolated (04/05/2024)   Social Connection and Isolation Panel    Frequency of Communication with Friends and Family: Once a week    Frequency of Social Gatherings with Friends and Family: More than three times a week    Attends Religious Services: More than 4 times per year    Active Member of [employer] or Organizations: No    Attends Engineer, structural: Never    Marital Status: Never married    Tobacco Counseling Counseling given: Not Answered    Clinical Intake:  Pre-visit preparation completed: Yes  Pain : No/denies pain     BMI - recorded: 21.3 Nutritional Status: BMI of 19-24  Normal Nutritional Risks: None Diabetes: No  Lab Results  Component Value Date   HGBA1C 5.4 04/08/2023     How often do you need to have someone help you when you read instructions, pamphlets, or other written materials from your doctor or pharmacy?: 5 - Always  Interpreter Needed?: No  Information entered by :: JHONNIE DAS, LPN   Activities of Daily Living     04/05/2024    8:23 AM  In your present state of health, do you have any difficulty performing the following activities:  Hearing? 0  Vision? 0  Difficulty  concentrating or making decisions? 1  Walking or climbing stairs? 1  Dressing or bathing? 0  Doing errands, shopping? 1  Preparing Food and eating ? Y  Comment DOESN'T COOK OR OPERATE STOVE  Using the Toilet? N  In the past six months, have you accidently leaked urine? N  Do you have problems with loss of bowel control? N  Managing your Medications? Y  Managing your Finances? Y  Housekeeping or managing your Housekeeping? Y    Patient Care Team: Donzella Lauraine SAILOR, DO as PCP - General (Family Medicine) [facility], [facility] (Optometry)  I have updated your Care Teams any recent Medical Services you may have received from other providers in the past year.     Assessment:  This is a routine wellness examination for Jacob Schmidt.  Hearing/Vision screen Hearing Screening - Comments:: NO AIDS Vision Screening - Comments:: WEARS GLASSES OCCASIONALLY- [place]ARD   Goals Addressed             This Visit's Progress    DIET - INCREASE WATER INTAKE         Depression Screen     04/05/2024    8:18 AM 10/16/2023    9:42 AM 04/01/2023    9:07 AM 10/03/2021    8:17 AM 08/27/2021   11:06 AM 04/10/2020    9:04 AM 01/26/2020    9:06 AM  PHQ 2/9 Scores  PHQ - 2 Score 2 2 0 0 0 0 0  PHQ- 9 Score 2 5  0 0 0     Fall Risk     04/05/2024    8:22 AM 04/01/2023    9:04 AM 10/03/2021    8:19 AM 08/27/2021   11:06 AM 04/10/2020    9:03 AM  Fall Risk   Falls in the past year? 0 0 0 0 0  Number falls in past yr: 0 0 0 0 0  Injury with Fall? 0 0 0 0 0  Risk for fall due to : No Fall Risks No Fall Risks No Fall Risks No Fall Risks   Follow up Falls evaluation completed;Falls prevention discussed Education provided;Falls prevention discussed Falls evaluation completed        Data saved with a previous flowsheet row definition    MEDICARE RISK AT HOME:  Medicare Risk at Home Any stairs in or around the home?: Yes If so, are there any without handrails?: No Home free of loose throw rugs in walkways,  pet beds, electrical cords, etc?: Yes Adequate lighting in your home to reduce risk of falls?: Yes Life alert?: No Use of a cane, walker or w/c?: No Grab bars in the bathroom?: No Shower chair or bench in shower?: No Elevated toilet seat or a handicapped toilet?: No  TIMED UP AND GO:  Was the test performed?  No  Cognitive Function: Impaired: Patient has current diagnosis of cognitive impairment.        Immunizations Immunization History  Administered Date(s) Administered   DTP 09/07/1969, 11/27/1969, 11/15/1970, 05/14/1974   Influenza Split 06/06/2006, 05/17/2011   Influenza,inj,Quad PF,6+ Mos 05/04/2013, 05/08/2016, 05/19/2017   MMR 08/31/1970   OPV 09/07/1969, 11/27/1969, 11/15/1970, 05/14/1974   PFIZER(Purple Top)SARS-COV-2 Vaccination 04/04/2020, 04/25/2020   Pneumococcal Polysaccharide-23 06/11/1994   Td 05/14/1974, 05/29/1985, 04/09/1995   Tdap 05/30/2005, 06/27/2007, 05/17/2011    Screening Tests Health Maintenance  Topic Date Due   Hepatitis B Vaccines 19-59 Average Risk (1 of 3 - 19+ 3-dose series) Never done   Fecal DNA (Cologuard)  Never done   Pneumococcal Vaccine: 50+ Years (2 of 2 - PCV) 05/08/2019   Zoster Vaccines- Shingrix (1 of 2) Never done   DTaP/Tdap/Td (10 - Td or Tdap) 05/16/2021   COVID-19 Vaccine (3 - 2024-25 season) 04/12/2023   INFLUENZA VACCINE  03/11/2024   Medicare Annual Wellness (AWV)  04/05/2025   Hepatitis C Screening  Completed   HIV Screening  Completed   HPV VACCINES  Aged Out   Meningococcal B Vaccine  Aged Out   Colonoscopy  Discontinued    Health Maintenance  Health Maintenance Due  Topic Date Due   Hepatitis B Vaccines 19-59 Average Risk (1 of 3 - 19+ 3-dose series) Never done   Fecal DNA (Cologuard)  Never done   Pneumococcal  Vaccine: 50+ Years (2 of 2 - PCV) 05/08/2019   Zoster Vaccines- Shingrix (1 of 2) Never done   DTaP/Tdap/Td (10 - Td or Tdap) 05/16/2021   COVID-19 Vaccine (3 - 2024-25 season) 04/12/2023    INFLUENZA VACCINE  03/11/2024   Health Maintenance Items Addressed: UP TO DATE ON COLOGUARD; NEEDS SHINGRIX, TDAP, PNA, COVID  Additional Screening:  Vision Screening: Recommended annual ophthalmology exams for early detection of glaucoma and other disorders of the eye. Would you like a referral to an eye doctor? No    Dental Screening: Recommended annual dental exams for proper oral hygiene  Community Resource Referral / Chronic Care Management: CRR required this visit?  No   CCM required this visit?  No   Plan:    I have personally reviewed and noted the following in the patient's chart:   Medical and social history Use of alcohol, tobacco or illicit drugs  Current medications and supplements including opioid prescriptions. Patient is not currently taking opioid prescriptions. Functional ability and status Nutritional status Physical activity Advanced directives List of other physicians Hospitalizations, surgeries, and ER visits in previous 12 months Vitals Screenings to include cognitive, depression, and falls Referrals and appointments  In addition, I have reviewed and discussed with patient certain preventive protocols, quality metrics, and best practice recommendations. A written personalized care plan for preventive services as well as general preventive health recommendations were provided to patient.   Jhonnie GORMAN Das, LPN   [id number]   After Visit Summary: (MyChart) Due to this being a telephonic visit, the after visit summary with patients personalized plan was offered to patient via MyChart   Notes: Nothing significant to report at this time.

## 2024-04-05 NOTE — Patient Instructions (Signed)
 Mr. Jacob Schmidt , Thank you for taking time out of your busy schedule to complete your Annual Wellness Visit with me. I enjoyed our conversation and look forward to speaking with you again next year. I, as well as your care team,  appreciate your ongoing commitment to your health goals. Please review the following plan we discussed and let me know if I can assist you in the future.  Follow up Visits: 04/11/25 @ 8:10 AM BY PHONE We will see or speak with you next year for your Next Medicare AWV with our clinical staff Have you seen your provider in the last 6 months (3 months if uncontrolled diabetes)? Yes  Clinician Recommendations:  Aim for 30 minutes of exercise or brisk walking, 6-8 glasses of water, and 5 servings of fruits and vegetables each day. TAKE CARE!      This is a list of the screenings recommended for you:  Health Maintenance  Topic Date Due   Hepatitis B Vaccine (1 of 3 - 19+ 3-dose series) Never done   Cologuard (Stool DNA test)  Never done   Pneumococcal Vaccine for age over 25 (2 of 2 - PCV) 05/08/2019   Zoster (Shingles) Vaccine (1 of 2) Never done   DTaP/Tdap/Td vaccine (10 - Td or Tdap) 05/16/2021   COVID-19 Vaccine (3 - 2024-25 season) 04/12/2023   Flu Shot  03/11/2024   Medicare Annual Wellness Visit  04/05/2025   Hepatitis C Screening  Completed   HIV Screening  Completed   HPV Vaccine  Aged Out   Meningitis B Vaccine  Aged Out   Colon Cancer Screening  Discontinued    Advanced directives: (ACP Link)Information on Advanced Care Planning can be found at [facility] Advance Health Care Directives Advance Health Care Directives. [email or web address]  Advance Care Planning is important because it:  [x]  Makes sure you receive the medical care that is consistent with your values, goals, and preferences  [x]  It provides guidance to your family and loved ones and reduces their decisional burden about whether or not they are making the right decisions based on your  wishes.  Follow the link provided in your after visit summary or read over the paperwork we have mailed to you to help you started getting your Advance Directives in place. If you need assistance in completing these, please reach out to us  so that we can help you!

## 2024-04-18 ENCOUNTER — Encounter: Payer: Self-pay | Admitting: Family Medicine

## 2024-04-18 ENCOUNTER — Ambulatory Visit (INDEPENDENT_AMBULATORY_CARE_PROVIDER_SITE_OTHER): Admitting: Family Medicine

## 2024-04-18 VITALS — BP 111/64 | HR 59 | Ht <= 58 in | Wt 93.0 lb

## 2024-04-18 DIAGNOSIS — Z1211 Encounter for screening for malignant neoplasm of colon: Secondary | ICD-10-CM

## 2024-04-18 DIAGNOSIS — Z23 Encounter for immunization: Secondary | ICD-10-CM

## 2024-04-18 DIAGNOSIS — E559 Vitamin D deficiency, unspecified: Secondary | ICD-10-CM | POA: Diagnosis not present

## 2024-04-18 DIAGNOSIS — Z1212 Encounter for screening for malignant neoplasm of rectum: Secondary | ICD-10-CM | POA: Diagnosis not present

## 2024-04-18 DIAGNOSIS — M109 Gout, unspecified: Secondary | ICD-10-CM | POA: Diagnosis not present

## 2024-04-18 DIAGNOSIS — F424 Excoriation (skin-picking) disorder: Secondary | ICD-10-CM

## 2024-04-18 DIAGNOSIS — E039 Hypothyroidism, unspecified: Secondary | ICD-10-CM

## 2024-04-18 DIAGNOSIS — J301 Allergic rhinitis due to pollen: Secondary | ICD-10-CM | POA: Diagnosis not present

## 2024-04-18 NOTE — Patient Instructions (Addendum)
 Recommended vaccines at pharmacy: Tdap, Shingrix series, and Hepatitis B series.

## 2024-04-18 NOTE — Progress Notes (Signed)
 Established patient visit   Patient: Jacob Schmidt   DOB: [birth year]   53 y.o. Male  MRN: [id number] Visit Date: 04/18/2024  Today's healthcare provider: LAURAINE LOISE BUOY, DO   Chief Complaint  Patient presents with   Follow-up    No concerns    Subjective    HPI Jacob Schmidt is a 53 year old male who presents for a follow-up visit.  He is taking levothyroxine  75 mcg daily for hypothyroidism with no recent changes in dosage and no new symptoms related to hypothyroidism.  He takes vitamin D  supplements 1000 units daily, and has not previously been on high-dose therapy.  For elevated LDL and triglycerides, he takes omega-3 fatty acids and has not been prescribed other specific medications for cholesterol management.   He takes Zyrtec daily for allergies, which has significantly improved his symptoms. He no longer uses Flonase and does not have a current EpiPen, as his allergies are well-controlled and he is no longer receiving allergy shots.  He has a history of gout and uses colchicine  as needed for flares, though he has not needed it recently.  He experiences compulsive skin picking, particularly on his hands, which has worsened over the past six months. Wearing gloves has helped mitigate the issue.  He has a history of earwax buildup and plans to follow up with ENT for removal, particularly in the right ear.  He plans to get a tetanus vaccine at the pharmacy.      Medications: Outpatient Medications Prior to Visit  Medication Sig Note   cetirizine (ZYRTEC) 10 MG tablet Take 10 mg by mouth daily.    cholecalciferol (VITAMIN D3) 25 MCG (1000 UNIT) tablet Take 1,000 Units by mouth daily. Nature made    colchicine  0.6 MG tablet 1.2 mg (two 0.6-mg tablets) orally at the first sign of a flare followed by 0.6 mg (1 tablet) one hour later. May take one tablet every 12 hours thereafter until flare resolves.    lactobacillus acidophilus (BACID) TABS tablet Take 2 tablets by  mouth 3 (three) times daily.    levothyroxine  (SYNTHROID ) 75 MCG tablet TAKE 1 TABLET(75 MCG) BY MOUTH DAILY BEFORE BREAKFAST    OMEGA-3 FATTY ACIDS PO Take by mouth daily.     [DISCONTINUED] EPINEPHrine 0.3 mg/0.3 mL IJ SOAJ injection  04/18/2024: was prescribed for use if-needed during allergy shots   [DISCONTINUED] fluticasone (FLONASE) 50 MCG/ACT nasal spray 1 puff per nostril daily (Patient not taking: Reported on 04/18/2024)    No facility-administered medications prior to visit.    Review of Systems  Respiratory: Negative.  Negative for cough, shortness of breath and wheezing.   Cardiovascular:  Negative for chest pain, palpitations and leg swelling.  Gastrointestinal:  Negative for abdominal pain, nausea and vomiting.  Neurological:  Negative for weakness and headaches.        Objective    BP 111/64 (BP Location: Right Arm, Patient Position: Sitting)   Pulse (!) 59   Ht 4' 8 (1.422 m)   Wt 93 lb (42.2 kg)   SpO2 100%   BMI 20.85 kg/m     Physical Exam Vitals reviewed.  Constitutional:      General: He is not in acute distress.    Appearance: Normal appearance. He is not diaphoretic.  HENT:     Head: Normocephalic and atraumatic.  Eyes:     General: No scleral icterus.    Conjunctiva/sclera: Conjunctivae normal.  Cardiovascular:     Rate  and Rhythm: Normal rate and regular rhythm.     Pulses: Normal pulses.     Heart sounds: Normal heart sounds. No murmur heard. Pulmonary:     Effort: Pulmonary effort is normal. No respiratory distress.     Breath sounds: Normal breath sounds. No wheezing or rhonchi.  Musculoskeletal:     Cervical back: Neck supple.     Right lower leg: No edema.     Left lower leg: No edema.  Lymphadenopathy:     Cervical: No cervical adenopathy.  Skin:    General: Skin is warm and dry.     Findings: No rash.  Neurological:     Mental Status: He is alert and oriented to person, place, and time. Mental status is at baseline.  Psychiatric:         Mood and Affect: Mood normal.        Behavior: Behavior normal.      No results found for any visits on 04/18/24.  Assessment & Plan    Adult hypothyroidism -     TSH  Vitamin D  deficiency, unspecified -     VITAMIN D  25 Hydroxy (Vit-D Deficiency, Fractures)  Gout, unspecified cause, unspecified chronicity, unspecified site  Excoriation (skin-picking) disorder  Seasonal allergic rhinitis due to pollen  Encounter for colorectal cancer screening -     Cologuard  Pneumococcal vaccination administered during current admission -     Pneumococcal conjugate vaccine 20-valent  Flu vaccine need -     Flu vaccine trivalent PF, 6mos and older(Flulaval,Afluria,Fluarix,Fluzone)     Hypothyroidism Condition well-managed with levothyroxine  75 mcg daily. No new symptoms reported. - Continue levothyroxine  75 mcg daily. - Order thyroid  function tests.  Vitamin D  deficiency Managed with daily supplementation. No recent dosage adjustments. - Continue current vitamin D  supplementation. - Order vitamin D  level.  Gout Managed with colchicine  as needed. No recent flares reported. - Continue colchicine  as needed for gout flares.  Allergic rhinitis Well-controlled with daily Zyrtec. No use of Flonase or EpiPen required. - Continue Zyrtec daily.  Excoriation (skin-picking) behavior Behavior worsened over six months, involving hands. Gloves reduced hand picking. - Continue using gloves to prevent skin picking.    Return in about 6 months (around 10/16/2024) for Chronic f/u.      I discussed the assessment and treatment plan with the patient  The patient was provided an opportunity to ask questions and all were answered. The patient agreed with the plan and demonstrated an understanding of the instructions.   The patient was advised to call back or seek an in-person evaluation if the symptoms worsen or if the condition fails to improve as anticipated.    LAURAINE LOISE BUOY, DO   [facility] [phone] (phone) [phone] (fax)  [facility]

## 2024-04-19 LAB — TSH: TSH: 0.111 u[IU]/mL — ABNORMAL LOW (ref 0.450–4.500)

## 2024-04-19 LAB — VITAMIN D 25 HYDROXY (VIT D DEFICIENCY, FRACTURES): Vit D, 25-Hydroxy: 42.9 ng/mL (ref 30.0–100.0)

## 2024-04-27 ENCOUNTER — Ambulatory Visit: Payer: Self-pay | Admitting: Family Medicine

## 2024-04-27 DIAGNOSIS — E039 Hypothyroidism, unspecified: Secondary | ICD-10-CM

## 2024-04-27 MED ORDER — LEVOTHYROXINE SODIUM 50 MCG PO TABS: 50.0000 ug | ORAL_TABLET | Freq: Every day | ORAL | 0 refills | Status: DC

## 2024-04-27 MED ORDER — LEVOTHYROXINE SODIUM 25 MCG PO TABS: 12.5000 ug | ORAL_TABLET | Freq: Every day | ORAL | 0 refills | Status: DC

## 2024-07-30 ENCOUNTER — Other Ambulatory Visit: Payer: Self-pay | Admitting: Family Medicine

## 2024-07-30 DIAGNOSIS — E039 Hypothyroidism, unspecified: Secondary | ICD-10-CM

## 2025-04-11 ENCOUNTER — Ambulatory Visit
